# Patient Record
Sex: Female | Born: 1965 | Race: White | Hispanic: No | Marital: Single | Smoking: Current every day smoker
Health system: Southern US, Community
[De-identification: ages and names within clinical notes are randomized; demographics above are authoritative.]

## PROBLEM LIST (undated history)

## (undated) DIAGNOSIS — S0990XA Unspecified injury of head, initial encounter: Secondary | ICD-10-CM

## (undated) DIAGNOSIS — N390 Urinary tract infection, site not specified: Secondary | ICD-10-CM

## (undated) DIAGNOSIS — F419 Anxiety disorder, unspecified: Secondary | ICD-10-CM

## (undated) DIAGNOSIS — G43909 Migraine, unspecified, not intractable, without status migrainosus: Secondary | ICD-10-CM

## (undated) DIAGNOSIS — J449 Chronic obstructive pulmonary disease, unspecified: Secondary | ICD-10-CM

## (undated) DIAGNOSIS — K219 Gastro-esophageal reflux disease without esophagitis: Secondary | ICD-10-CM

## (undated) DIAGNOSIS — Z720 Tobacco use: Secondary | ICD-10-CM

## (undated) DIAGNOSIS — G8929 Other chronic pain: Secondary | ICD-10-CM

## (undated) DIAGNOSIS — R519 Headache, unspecified: Secondary | ICD-10-CM

## (undated) HISTORY — DX: Urinary tract infection, site not specified: N39.0

## (undated) HISTORY — DX: Other chronic pain: G89.29

## (undated) HISTORY — DX: Tobacco use: Z72.0

## (undated) HISTORY — PX: FRACTURE SURGERY: SHX138

## (undated) HISTORY — DX: Anxiety disorder, unspecified: F41.9

## (undated) HISTORY — DX: Chronic obstructive pulmonary disease, unspecified: J44.9

## (undated) HISTORY — DX: Headache, unspecified: R51.9

## (undated) HISTORY — DX: Migraine, unspecified, not intractable, without status migrainosus: G43.909

## (undated) HISTORY — DX: Unspecified injury of head, initial encounter: S09.90XA

## (undated) HISTORY — DX: Gastro-esophageal reflux disease without esophagitis: K21.9

## (undated) HISTORY — PX: NO PAST SURGERIES: SHX2092

---
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
## 2009-06-07 ENCOUNTER — Encounter: Admission: RE | Admit: 2009-06-07 | Discharge: 2009-06-07 | Payer: Self-pay | Admitting: Nurse Practitioner

## 2012-12-21 ENCOUNTER — Other Ambulatory Visit: Payer: Self-pay | Admitting: Podiatry

## 2012-12-21 DIAGNOSIS — M7750 Other enthesopathy of unspecified foot: Secondary | ICD-10-CM

## 2012-12-21 DIAGNOSIS — M7660 Achilles tendinitis, unspecified leg: Secondary | ICD-10-CM

## 2012-12-27 ENCOUNTER — Other Ambulatory Visit: Payer: Self-pay

## 2019-06-07 ENCOUNTER — Other Ambulatory Visit: Payer: Self-pay | Admitting: Physician Assistant

## 2019-06-07 DIAGNOSIS — Z1231 Encounter for screening mammogram for malignant neoplasm of breast: Secondary | ICD-10-CM

## 2019-06-07 DIAGNOSIS — Z803 Family history of malignant neoplasm of breast: Secondary | ICD-10-CM

## 2019-06-09 ENCOUNTER — Other Ambulatory Visit: Payer: Self-pay

## 2019-06-09 ENCOUNTER — Ambulatory Visit
Admission: RE | Admit: 2019-06-09 | Discharge: 2019-06-09 | Disposition: A | Discharge status: Home / Self Care | Payer: Self-pay | Source: Ambulatory Visit | Attending: Physician Assistant | Admitting: Physician Assistant

## 2019-06-09 DIAGNOSIS — Z803 Family history of malignant neoplasm of breast: Secondary | ICD-10-CM

## 2019-06-09 DIAGNOSIS — Z1231 Encounter for screening mammogram for malignant neoplasm of breast: Secondary | ICD-10-CM

## 2019-09-05 NOTE — Progress Notes (Addendum)
[id number]    Provider:  Dr Lucia Gaskins Requesting Provider: Mitzi Hansen, NP Primary Care Provider:  Roseanna Rainbow, PA-C (Inactive)  CC:  migraines  HPI:  Emily Holmes is a 54 y.o. female here as requested by Mitzi Hansen, NP for migraines.  I reviewed [facility] notes: Past medical history migraines, anxiety, stress incontinence, mixed type COPD with acute bronchitis, adnexal fullness and tenderness.  Medications that she has tried that could be used in migraine management include Wellbutrin and topiramate, steroids, sumatriptan, propranolol.  She has apparently been to a headache clinic before but told Mitzi Hansen that she was not impressed with them.  At last appointment she reported increased frequency, lately getting headaches about once a week, episode lasting 1 to 3 days, headaches causing vision changes, vertigo, nausea, pressure behind the eyes, body temp changes, associated light and sound sensitivity, head feels like someone is driving red hot ice packs, barb wire wrapped around her head, she has also tried Fioricet, B complex, feverfew, riboflavin and magnesium, propranolol.  Recent labs include CMP collected June 01, 2019, essentially normal, BUN 7, creatinine 0.48.  Imitrex makes her sleepy, in the past they had discussed Elavil and Aimovig or other injectables, ibuprofen when she develops migraines, significant weather seems to bring on migraines as well, she has associated visual disturbance, nausea, tinnitus and dizziness, she apparently has FMLA where she is limited to 10 hours a day due to her migraines, she can only work 6 days in a row, she definitely noted more headaches with working many days in a row such as 21 which she reports her work has asked of her.  I reviewed records all the way through 2017, she is also stated headaches are severe 7 out of 10.  meds tried: Wellbutrin and topiramate, steroids, sumatriptan, propranolol ,topiramate,  fioricet, b complex, feverfew, riboflavin, magnesium,   Patient states migraines since childhood, mother/nephew/sister/grandmother; extensive family history f migraines. She has had 5 head injuries in childhood unsure if related. Migraines started worsening since early menopause in 3s and slowly progressively worsening, over the last year or longer she has daily headaches, she wakes up with daily headaches and we discussed a sleep study and she declines. She does not sleep well. Migraines cause disequilibrium, vertigo, vision changes can't see the words on the past, can be unilateral or start both sides, from the back, pulsating/pounding/thrbbing.ice picks, nausea, light/sound sensitivity, movement make it worse, laying down in a black out room helps, weather barometric pressure makes it worse. She has vertigo, half her body gets cold even without the headache and happens with the headache pain. She has ringing in her ears not pulsatile, no aura, no medication overuse,also feels aband around the head and severe pressure,  ibuprofen doesn't really help. No other focal neurologic deficits, associated symptoms, inciting events or modifiable factors.  Reviewed notes, labs and imaging from outside physicians, which showed: see above  She had labs this past march, will request results do not have them  Reviewed MRI of th ebrain report:  IMPRESSION:  1. No acute intracranial abnormality.  2. At least a single small discrete white matter lesion in both external  capsules. These lesions are abnormal but nonspecific, usually resulting  from benign/remote/incidental causes (e.g. Prior  trauma/inflammation/demyelinization, or chronic ischemia associated with  migraine/atherosclerosis/other vasculopathies processes). Favor mild  chronic ischemic/age-relatedchanges   Review of Systems: Patient complains of symptoms per HPI as well as the following symptoms: anxiety, smoking, headache. Pertinent negatives  and  positives per HPI. All others negative.   Social History   Socioeconomic History   Marital status: Single    Spouse name: Not on file   Number of children: 1   Years of education: Not on file   Highest education level: Not on file  Occupational History   Not on file  Tobacco Use   Smoking status: Current Every Day Smoker    Packs/day: 1.00    Types: Cigarettes   Smokeless tobacco: Never Used  Substance and Sexual Activity   Alcohol use: Yes    Comment: occasional glass of wine   Drug use: Not Currently    Comment: used weed in her 83s    Sexual activity: Not on file  Other Topics Concern   Not on file  Social History Narrative   Lives alone   Caffeine: 2 large cups per day    Social Determinants of Health   Financial Resource Strain:    Difficulty of Paying Living Expenses:   Food Insecurity:    Worried About Programme researcher, broadcasting/film/video in the Last Year:    Barista in the Last Year:   Transportation Needs:    Freight forwarder (Medical):    Lack of Transportation (Non-Medical):   Physical Activity:    Days of Exercise per Week:    Minutes of Exercise per Session:   Stress:    Feeling of Stress :   Social Connections:    Frequency of Communication with Friends and Family:    Frequency of Social Gatherings with Friends and Family:    Attends Religious Services:    Active Member of Clubs or Organizations:    Attends Engineer, structural:    Marital Status:   Intimate Partner Violence:    Fear of Current or Ex-Partner:    Emotionally Abused:    Physically Abused:    Sexually Abused:     Family History  Problem Relation Age of Onset   Migraines Mother    Colon cancer Mother    Kidney cancer Mother        started here, small cell, metastastic, less than 6 months    Diabetes Mother    Heart disease Brother    Cancer Brother    Migraines Maternal Grandmother    Stroke Maternal Grandmother    Heart  attack Maternal Grandmother    Cancer Maternal Grandmother    Emphysema Maternal Grandmother    COPD Maternal Grandmother    Migraines Other        on maternal side    Migraines Nephew     Past Medical History:  Diagnosis Date   Acid reflux    Anxiety    claustrophobia   Chronic headaches    COPD (chronic obstructive pulmonary disease) (HCC)    Fracture    R heel hairline around 2007   Fracture 1974   left arm s/p internal fixation   Frequent urinary tract infections    Head injury    as a child x5    Migraines    Tobacco use     Patient Active Problem List   Diagnosis Date Noted   Chronic migraine without aura without status migrainosus, not intractable 09/06/2019    Past Surgical History:  Procedure Laterality Date   NO PAST SURGERIES      Current Outpatient Medications  Medication Sig Dispense Refill   albuterol (PROAIR HFA) 108 (90 Base) MCG/ACT inhaler Inhale 2 puffs into the lungs 3 (  three) times daily as needed for wheezing or shortness of breath.     busPIRone (BUSPAR) 10 MG tablet Take 10 mg by mouth 2 (two) times daily.      cetirizine (ZYRTEC) 10 MG tablet Take 10 mg by mouth daily.     clobetasol cream (TEMOVATE) 0.05 % Apply topically 2 (two) times daily.     Cranberry-Vitamin C-Vitamin E 140-100-3 MG-MG-UNIT CAPS Take by mouth daily.      omeprazole (PRILOSEC) 20 MG capsule Take 20 mg by mouth daily.     propranolol ER (INDERAL LA) 160 MG SR capsule Take 160 mg by mouth daily.     SUMAtriptan (IMITREX) 50 MG tablet Take 50 mg by mouth 2 (two) times daily as needed.     Fremanezumab-vfrm (AJOVY) 225 MG/1.5ML SOAJ Inject 225 mg into the skin every 30 (thirty) days. 3 pen 4   Ubrogepant (UBRELVY) 100 MG TABS Take 100 mg by mouth every 2 (two) hours as needed. Maximum 200mg  a day. 9 tablet 11   No current facility-administered medications for this visit.    Allergies as of 09/06/2019 - Review Complete 09/06/2019  Allergen  Reaction Noted   Codeine Hives and Itching 09/06/2019   Other  09/06/2019   Shellfish-derived products  09/06/2019   Topiramate  09/06/2019   Wellbutrin [bupropion]  09/06/2019    Vitals: BP 122/82 (BP Location: Right Arm, Patient Position: Sitting)    Pulse 63    Ht 5' 2.75" (1.594 m)    Wt 195 lb (88.5 kg)    SpO2 94%    BMI 34.82 kg/m  Last Weight:  Wt Readings from Last 1 Encounters:  09/06/19 195 lb (88.5 kg)   Last Height:   Ht Readings from Last 1 Encounters:  09/06/19 5' 2.75" (1.594 m)     Physical exam: Exam: Gen: NAD, conversant, well nourised, obese, well groomed                     CV: RRR, no MRG. No Carotid Bruits. No peripheral edema, warm, nontender Eyes: Conjunctivae clear without exudates or hemorrhage  Neuro: Detailed Neurologic Exam  Speech:    Speech is normal; fluent and spontaneous with normal comprehension.  Cognition:    The patient is oriented to person, place, and time;     recent and remote memory intact;     language fluent;     normal attention, concentration,     fund of knowledge Cranial Nerves:    The pupils are equal, round, and reactive to light. Attempted could not visualize fundiVisual fields are full to finger confrontation. Extraocular movements are intact. Trigeminal sensation is intact and the muscles of mastication are normal. The face is symmetric. The palate elevates in the midline. Hearing intact. Voice is normal. Shoulder shrug is normal. The tongue has normal motion without fasciculations.   Coordination:    No dysmetria or ataxia  Gait:    Normal native gait  Motor Observation:    No asymmetry, no atrophy, and no involuntary movements noted. Tone:    Normal muscle tone.    Posture:    Posture is normal. normal erect    Strength:    Strength is V/V in the upper and lower limbs.      Sensation: intact to LT     Reflex Exam:  DTR's:    Deep tendon reflexes in the upper and lower extremities are  symmetrical bilaterally.   Toes:    The toes are  equiv bilaterally.   Clonus:    Clonus is absent.    Assessment/Plan:  37 year ol with chronic migraines, failed multiple medications, sill start Ajovy preventative and   over the last year or longer she has daily headaches, she wakes up with daily headaches and we discussed a sleep study and she declines.  Orders Placed This Encounter  Procedures   CBC   Comprehensive metabolic panel   TSH   Meds ordered this encounter  Medications   Fremanezumab-vfrm (AJOVY) 225 MG/1.5ML SOAJ    Sig: Inject 225 mg into the skin every 30 (thirty) days.    Dispense:  3 pen    Refill:  4    Patient has copay card; she can have medication for $5 regardless of insurance approval or copay amount.   Ubrogepant (UBRELVY) 100 MG TABS    Sig: Take 100 mg by mouth every 2 (two) hours as needed. Maximum 200mg  a day.    Dispense:  9 tablet    Refill:  11    Cc: , NP,  Mitzi Hansen, PA-C (Inactive)  Roseanna Rainbow, MD  [facility] [address] [place], [place]  Phone [phone] Fax [phone]

## 2019-09-06 ENCOUNTER — Other Ambulatory Visit: Payer: Self-pay

## 2019-09-06 ENCOUNTER — Encounter: Payer: Self-pay | Admitting: Neurology

## 2019-09-06 ENCOUNTER — Ambulatory Visit: Payer: BC Managed Care – PPO | Admitting: Neurology

## 2019-09-06 VITALS — BP 122/82 | HR 63 | Ht 62.75 in | Wt 195.0 lb

## 2019-09-06 DIAGNOSIS — G43709 Chronic migraine without aura, not intractable, without status migrainosus: Secondary | ICD-10-CM | POA: Diagnosis not present

## 2019-09-06 MED ORDER — UBRELVY 100 MG PO TABS: 100.0000 mg | ORAL_TABLET | ORAL | 11 refills | Status: DC | PRN

## 2019-09-06 MED ORDER — AJOVY 225 MG/1.5ML ~~LOC~~ SOAJ: 225.0000 mg | SUBCUTANEOUS | 4 refills | Status: DC

## 2019-09-06 NOTE — Patient Instructions (Signed)
Start Ajovy for prevention Start Ubrelvy for acute management  Ubrogepant tablets What is this medicine? UBROGEPANT (ue BROE je pant) is used to treat migraine headaches with or without aura. An aura is a strange feeling or visual disturbance that warns you of an attack. It is not used to prevent migraines. This medicine may be used for other purposes; ask your health care provider or pharmacist if you have questions. COMMON BRAND NAME(S): Bernita Raisin What should I tell my health care provider before I take this medicine? They need to know if you have any of these conditions:  kidney disease  liver disease  an unusual or allergic reaction to ubrogepant, other medicines, foods, dyes, or preservatives  pregnant or trying to get pregnant  breast-feeding How should I use this medicine? Take this medicine by mouth with a glass of water. Follow the directions on the prescription label. You can take it with or without food. If it upsets your stomach, take it with food. Take your medicine at regular intervals. Do not take it more often than directed. Do not stop taking except on your doctor's advice. Talk to your pediatrician about the use of this medicine in children. Special care may be needed. Overdosage: If you think you have taken too much of this medicine contact a poison control center or emergency room at once. NOTE: This medicine is only for you. Do not share this medicine with others. What if I miss a dose? This does not apply. This medicine is not for regular use. What may interact with this medicine? Do not take this medicine with any of the following medicines:  ceritinib  certain antibiotics like chloramphenicol, clarithromycin, telithromycin  certain antivirals for HIV like atazanavir, cobicistat, darunavir, delavirdine, fosamprenavir, indinavir, ritonavir  certain medicines for fungal infections like itraconazole, ketoconazole, posaconazole,  voriconazole  conivaptan  grapefruit  idelalisib  mifepristone  nefazodone  ribociclib This medicine may also interact with the following medications:  carvedilol  certain medicines for seizures like phenobarbital, phenytoin  ciprofloxacin  cyclosporine  eltrombopag  fluconazole  fluvoxamine  quinidine  rifampin  St. John's wort  verapamil This list may not describe all possible interactions. Give your health care provider a list of all the medicines, herbs, non-prescription drugs, or dietary supplements you use. Also tell them if you smoke, drink alcohol, or use illegal drugs. Some items may interact with your medicine. What should I watch for while using this medicine? Visit your health care professional for regular checks on your progress. Tell your health care professional if your symptoms do not start to get better or if they get worse. Your mouth may get dry. Chewing sugarless gum or sucking hard candy and drinking plenty of water may help. Contact your health care professional if the problem does not go away or is severe. What side effects may I notice from receiving this medicine? Side effects that you should report to your doctor or health care professional as soon as possible:  allergic reactions like skin rash, itching or hives; swelling of the face, lips, or tongue Side effects that usually do not require medical attention (report these to your doctor or health care professional if they continue or are bothersome):  drowsiness  dry mouth  nausea  tiredness This list may not describe all possible side effects. Call your doctor for medical advice about side effects. You may report side effects to FDA at 1-800-FDA-1088. Where should I keep my medicine? Keep out of the reach of children. Store  at room temperature between 15 and 30 degrees C (59 and 86 degrees F). Throw away any unused medicine after the expiration date. NOTE: This sheet is a summary. It  may not cover all possible information. If you have questions about this medicine, talk to your doctor, pharmacist, or health care provider.  2020 Elsevier/Gold Standard (2018-05-12 08:50:55) Vernell Barrier injection What is this medicine? FREMANEZUMAB (fre ma NEZ ue mab) is used to prevent migraine headaches. This medicine may be used for other purposes; ask your health care provider or pharmacist if you have questions. COMMON BRAND NAME(S): AJOVY What should I tell my health care provider before I take this medicine? They need to know if you have any of these conditions:  an unusual or allergic reaction to fremanezumab, other medicines, foods, dyes, or preservatives  pregnant or trying to get pregnant  breast-feeding How should I use this medicine? This medicine is for injection under the skin. You will be taught how to prepare and give this medicine. Use exactly as directed. Take your medicine at regular intervals. Do not take your medicine more often than directed. It is important that you put your used needles and syringes in a special sharps container. Do not put them in a trash can. If you do not have a sharps container, call your pharmacist or healthcare provider to get one. Talk to your pediatrician regarding the use of this medicine in children. Special care may be needed. Overdosage: If you think you have taken too much of this medicine contact a poison control center or emergency room at once. NOTE: This medicine is only for you. Do not share this medicine with others. What if I miss a dose? If you miss a dose, take it as soon as you can. If it is almost time for your next dose, take only that dose. Do not take double or extra doses. What may interact with this medicine? Interactions are not expected. This list may not describe all possible interactions. Give your health care provider a list of all the medicines, herbs, non-prescription drugs, or dietary supplements you use. Also  tell them if you smoke, drink alcohol, or use illegal drugs. Some items may interact with your medicine. What should I watch for while using this medicine? Tell your doctor or healthcare professional if your symptoms do not start to get better or if they get worse. What side effects may I notice from receiving this medicine? Side effects that you should report to your doctor or health care professional as soon as possible:  allergic reactions like skin rash, itching or hives, swelling of the face, lips, or tongue Side effects that usually do not require medical attention (report these to your doctor or health care professional if they continue or are bothersome):  pain, redness, or irritation at site where injected This list may not describe all possible side effects. Call your doctor for medical advice about side effects. You may report side effects to FDA at 1-800-FDA-1088. Where should I keep my medicine? Keep out of the reach of children. You will be instructed on how to store this medicine. Throw away any unused medicine after the expiration date on the label. NOTE: This sheet is a summary. It may not cover all possible information. If you have questions about this medicine, talk to your doctor, pharmacist, or health care provider.  2020 Elsevier/Gold Standard (2016-11-23 17:22:56)

## 2019-09-06 NOTE — Addendum Note (Signed)
Addended by: Naomie Dean B on: 09/06/2019 10:17 AM   Modules accepted: Orders, Level of Service

## 2019-09-07 ENCOUNTER — Telehealth: Payer: Self-pay | Admitting: Neurology

## 2019-09-07 LAB — COMPREHENSIVE METABOLIC PANEL
ALT: 17 IU/L (ref 0–32)
AST: 20 IU/L (ref 0–40)
Albumin/Globulin Ratio: 1.7 (ref 1.2–2.2)
Albumin: 4.3 g/dL (ref 3.8–4.9)
Alkaline Phosphatase: 90 IU/L (ref 48–121)
BUN/Creatinine Ratio: 13 (ref 9–23)
BUN: 7 mg/dL (ref 6–24)
Bilirubin Total: 0.5 mg/dL (ref 0.0–1.2)
CO2: 26 mmol/L (ref 20–29)
Calcium: 9.4 mg/dL (ref 8.7–10.2)
Chloride: 101 mmol/L (ref 96–106)
Creatinine, Ser: 0.52 mg/dL — ABNORMAL LOW (ref 0.57–1.00)
GFR calc Af Amer: 128 mL/min/{1.73_m2} (ref >59)
GFR calc non Af Amer: 111 mL/min/{1.73_m2} (ref >59)
Globulin, Total: 2.6 g/dL (ref 1.5–4.5)
Glucose: 87 mg/dL (ref 65–99)
Potassium: 5 mmol/L (ref 3.5–5.2)
Sodium: 139 mmol/L (ref 134–144)
Total Protein: 6.9 g/dL (ref 6.0–8.5)

## 2019-09-07 LAB — CBC
Hematocrit: 43 % (ref 34.0–46.6)
Hemoglobin: 14.4 g/dL (ref 11.1–15.9)
MCH: 29.6 pg (ref 26.6–33.0)
MCHC: 33.5 g/dL (ref 31.5–35.7)
MCV: 88 fL (ref 79–97)
Platelets: 286 10*3/uL (ref 150–450)
RBC: 4.87 x10E6/uL (ref 3.77–5.28)
RDW: 11.9 % (ref 11.7–15.4)
WBC: 7.5 10*3/uL (ref 3.4–10.8)

## 2019-09-07 LAB — TSH: TSH: 1.94 u[IU]/mL (ref 0.450–4.500)

## 2019-09-07 MED ORDER — UBRELVY 100 MG PO TABS: 100.0000 mg | ORAL_TABLET | ORAL | 11 refills | Status: DC | PRN

## 2019-09-07 NOTE — Telephone Encounter (Signed)
Looks like it processed as a sample. I changed it to normal process and it was sent to pharmacy successfully.

## 2019-09-07 NOTE — Addendum Note (Signed)
Addended by: Bertram Savin on: 09/07/2019 02:50 PM   Modules accepted: Orders

## 2019-09-07 NOTE — Telephone Encounter (Signed)
Pt has called to report that the pharmacy confirmed her Ubrogepant (UBRELVY) 100 MG TABS  Was never called into [facility] [phone]

## 2019-09-13 ENCOUNTER — Telehealth: Payer: Self-pay | Admitting: *Deleted

## 2019-09-13 NOTE — Telephone Encounter (Signed)
Spoke with pt and advised her labs looks fine, unremarkable per Dr. Lucia Gaskins. Pt verbalized appreciation and understanding.

## 2019-09-13 NOTE — Telephone Encounter (Signed)
-----   Message from Anson Fret, MD sent at 09/07/2019  9:19 AM EDT ----- Labs look fine, unremarkable thanks

## 2020-01-01 ENCOUNTER — Encounter: Payer: Self-pay | Admitting: Family Medicine

## 2020-01-01 ENCOUNTER — Ambulatory Visit: Payer: BC Managed Care – PPO | Admitting: Family Medicine

## 2020-01-01 VITALS — BP 161/87 | HR 73 | Ht 62.0 in | Wt 199.0 lb

## 2020-01-01 DIAGNOSIS — G43709 Chronic migraine without aura, not intractable, without status migrainosus: Secondary | ICD-10-CM

## 2020-01-01 NOTE — Patient Instructions (Addendum)
We will continue Ajovy every month. Use Ubrelvy for abortive therapy. You can repeat tablet in 2 hours if needed. Avoid taking more than 2 in 24 hours or 10 per month. Consider Botox therapy if insurance will cover.   Stay well hydrated. Try incorporating regular water with your Bai water. Eat regular meals and try to exercise regularly.   Follow up closely with PCP for monitoring of BP. Consider sleep study.   Follow up with [place] in 1 year, sooner if needed.     Sleep Apnea Sleep apnea affects breathing during sleep. It causes breathing to stop for a short time or to become shallow. It can also increase the risk of:  Heart attack.  Stroke.  Being very overweight (obese).  Diabetes.  Heart failure.  Irregular heartbeat. The goal of treatment is to help you breathe normally again. What are the causes? There are three kinds of sleep apnea:  Obstructive sleep apnea. This is caused by a blocked or collapsed airway.  Central sleep apnea. This happens when the brain does not send the right signals to the muscles that control breathing.  Mixed sleep apnea. This is a combination of obstructive and central sleep apnea. The most common cause of this condition is a collapsed or blocked airway. This can happen if:  Your throat muscles are too relaxed.  Your tongue and tonsils are too large.  You are overweight.  Your airway is too small. What increases the risk?  Being overweight.  Smoking.  Having a small airway.  Being older.  Being female.  Drinking alcohol.  Taking medicines to calm yourself (sedatives or tranquilizers).  Having family members with the condition. What are the signs or symptoms?  Trouble staying asleep.  Being sleepy or tired during the day.  Getting angry a lot.  Loud snoring.  Headaches in the morning.  Not being able to focus your mind (concentrate).  Forgetting things.  Less interest in sex.  Mood swings.  Personality  changes.  Feelings of sadness (depression).  Waking up a lot during the night to pee (urinate).  Dry mouth.  Sore throat. How is this diagnosed?  Your medical history.  A physical exam.  A test that is done when you are sleeping (sleep study). The test is most often done in a sleep lab but may also be done at home. How is this treated?   Sleeping on your side.  Using a medicine to get rid of mucus in your nose (decongestant).  Avoiding the use of alcohol, medicines to help you relax, or certain pain medicines (narcotics).  Losing weight, if needed.  Changing your diet.  Not smoking.  Using a machine to open your airway while you sleep, such as: ? An oral appliance. This is a mouthpiece that shifts your lower jaw forward. ? A CPAP device. This device blows air through a mask when you breathe out (exhale). ? An EPAP device. This has valves that you put in each nostril. ? A BPAP device. This device blows air through a mask when you breathe in (inhale) and breathe out.  Having surgery if other treatments do not work. It is important to get treatment for sleep apnea. Without treatment, it can lead to:  High blood pressure.  Coronary artery disease.  In men, not being able to have an erection (impotence).  Reduced thinking ability. Follow these instructions at home: Lifestyle  Make changes that your doctor recommends.  Eat a healthy diet.  Lose weight if  needed.  Avoid alcohol, medicines to help you relax, and some pain medicines.  Do not use any products that contain nicotine or tobacco, such as cigarettes, e-cigarettes, and chewing tobacco. If you need help quitting, ask your doctor. General instructions  Take over-the-counter and prescription medicines only as told by your doctor.  If you were given a machine to use while you sleep, use it only as told by your doctor.  If you are having surgery, make sure to tell your doctor you have sleep apnea. You may  need to bring your device with you.  Keep all follow-up visits as told by your doctor. This is important. Contact a doctor if:  The machine that you were given to use during sleep bothers you or does not seem to be working.  You do not get better.  You get worse. Get help right away if:  Your chest hurts.  You have trouble breathing in enough air.  You have an uncomfortable feeling in your back, arms, or stomach.  You have trouble talking.  One side of your body feels weak.  A part of your face is hanging down. These symptoms may be an emergency. Do not wait to see if the symptoms will go away. Get medical help right away. Call your local emergency services (911 in the [place] Do not drive yourself to the hospital. Summary  This condition affects breathing during sleep.  The most common cause is a collapsed or blocked airway.  The goal of treatment is to help you breathe normally while you sleep. This information is not intended to replace advice given to you by your health care provider. Make sure you discuss any questions you have with your health care provider. Document Revised: 12/10/2017 Document Reviewed: 10/19/2017 Elsevier Patient Education  2020 [employer]   Migraine Headache A migraine headache is a very strong throbbing pain on one side or both sides of your head. This type of headache can also cause other symptoms. It can last from 4 hours to 3 days. Talk with your doctor about what things may bring on (trigger) this condition. What are the causes? The exact cause of this condition is not known. This condition may be triggered or caused by:  Drinking alcohol.  Smoking.  Taking medicines, such as: ? Medicine used to treat chest pain (nitroglycerin). ? Birth control pills. ? Estrogen. ? Some blood pressure medicines.  Eating or drinking certain products.  Doing physical activity. Other things that may trigger a migraine headache include:  Having a  menstrual period.  Pregnancy.  Hunger.  Stress.  Not getting enough sleep or getting too much sleep.  Weather changes.  Tiredness (fatigue). What increases the risk?  Being 3625-52 years old.  Being female.  Having a family history of migraine headaches.  Being Caucasian.  Having depression or anxiety.  Being very overweight. What are the signs or symptoms?  A throbbing pain. This pain may: ? Happen in any area of the head, such as on one side or both sides. ? Make it hard to do daily activities. ? Get worse with physical activity. ? Get worse around bright lights or loud noises.  Other symptoms may include: ? Feeling sick to your stomach (nauseous). ? Vomiting. ? Dizziness. ? Being sensitive to bright lights, loud noises, or smells.  Before you get a migraine headache, you may get warning signs (an aura). An aura may include: ? Seeing flashing lights or having blind spots. ? Seeing bright spots,  halos, or zigzag lines. ? Having tunnel vision or blurred vision. ? Having numbness or a tingling feeling. ? Having trouble talking. ? Having weak muscles.  Some people have symptoms after a migraine headache (postdromal phase), such as: ? Tiredness. ? Trouble thinking (concentrating). How is this treated?  Taking medicines that: ? Relieve pain. ? Relieve the feeling of being sick to your stomach. ? Prevent migraine headaches.  Treatment may also include: ? Having acupuncture. ? Avoiding foods that bring on migraine headaches. ? Learning ways to control your body functions (biofeedback). ? Therapy to help you know and deal with negative thoughts (cognitive behavioral therapy). Follow these instructions at home: Medicines  Take over-the-counter and prescription medicines only as told by your doctor.  Ask your doctor if the medicine prescribed to you: ? Requires you to avoid driving or using heavy machinery. ? Can cause trouble pooping (constipation). You may  need to take these steps to prevent or treat trouble pooping:  Drink enough fluid to keep your pee (urine) pale yellow.  Take over-the-counter or prescription medicines.  Eat foods that are high in fiber. These include beans, whole grains, and fresh fruits and vegetables.  Limit foods that are high in fat and sugar. These include fried or sweet foods. Lifestyle  Do not drink alcohol.  Do not use any products that contain nicotine or tobacco, such as cigarettes, e-cigarettes, and chewing tobacco. If you need help quitting, ask your doctor.  Get at least 8 hours of sleep every night.  Limit and deal with stress. General instructions      Keep a journal to find out what may bring on your migraine headaches. For example, write down: ? What you eat and drink. ? How much sleep you get. ? Any change in what you eat or drink. ? Any change in your medicines.  If you have a migraine headache: ? Avoid things that make your symptoms worse, such as bright lights. ? It may help to lie down in a dark, quiet room. ? Do not drive or use heavy machinery. ? Ask your doctor what activities are safe for you.  Keep all follow-up visits as told by your doctor. This is important. Contact a doctor if:  You get a migraine headache that is different or worse than others you have had.  You have more than 15 headache days in one month. Get help right away if:  Your migraine headache gets very bad.  Your migraine headache lasts longer than 72 hours.  You have a fever.  You have a stiff neck.  You have trouble seeing.  Your muscles feel weak or like you cannot control them.  You start to lose your balance a lot.  You start to have trouble walking.  You pass out (faint).  You have a seizure. Summary  A migraine headache is a very strong throbbing pain on one side or both sides of your head. These headaches can also cause other symptoms.  This condition may be treated with medicines  and changes to your lifestyle.  Keep a journal to find out what may bring on your migraine headaches.  Contact a doctor if you get a migraine headache that is different or worse than others you have had.  Contact your doctor if you have more than 15 headache days in a month. This information is not intended to replace advice given to you by your health care provider. Make sure you discuss any questions you have with your  health care provider. Document Revised: 06/17/2018 Document Reviewed: 04/07/2018 Elsevier Patient Education  2020 [employer].

## 2020-01-01 NOTE — Progress Notes (Addendum)
Chief Complaint  Patient presents with  . Follow-up    rm 2  . Migraine    pt said the meds helped for the first month     HISTORY OF PRESENT ILLNESS: Today 01/01/20  Emily Holmes is a 54 y.o. female here today for follow up for migraines. She was started on [place] in 08/2019. She also continues propranolol LA 160mg  daily. She feels that injections have helped with daily tension headaches. She continues to have near daily tension style headaches that are not as severe. has helped reduce severity of migraines but does not abort. She reports 2-3 migraines per month. She gets a migraine with every rain storm. She has never repeated Ubrelvy dose. She seems to have morning headaches often but states they can be worse in the evenings. BP 161/87 today. Last office reading 122/82. Blood pressure is usually "good" and it is only elevated when she has a migraine. She is unable to tell me a specific readings. She has a significant family history of HTN. She is followed by PCP every 6 months. Her mom had OSA. She does not wish to pursue sleep study. She knows that she will not use CPAP. She is not interested in considering other treatments for OSA. She drinks 2-3 Bai drinks daily. She does not drink regular water. She was previously advised to consider Botox but she states insurance will not cover it.    HISTORY (copied from Dr Bernita Raisin note on 09/06/2019)  HPI:  Emily Holmes is a 54 y.o. female here as requested by 46, NP for migraines.  I reviewed Emily Holmes notes: Past medical history migraines, anxiety, stress incontinence, mixed type COPD with acute bronchitis, adnexal fullness and tenderness.  Medications that she has tried that could be used in migraine management include Wellbutrin and topiramate, steroids, sumatriptan, propranolol.  She has apparently been to a headache clinic before but told [facility] that she was not impressed with them.  At last  appointment she reported increased frequency, lately getting headaches about once a week, episode lasting 1 to 3 days, headaches causing vision changes, vertigo, nausea, pressure behind the eyes, body temp changes, associated light and sound sensitivity, head feels like someone is driving red hot ice packs, barb wire wrapped around her head, she has also tried Fioricet, B complex, feverfew, riboflavin and magnesium, propranolol.  Recent labs include CMP collected June 01, 2019, essentially normal, BUN 7, creatinine 0.48.  Imitrex makes her sleepy, in the past they had discussed Elavil and Aimovig or other injectables, ibuprofen when she develops migraines, significant weather seems to bring on migraines as well, she has associated visual disturbance, nausea, tinnitus and dizziness, she apparently has FMLA where she is limited to 10 hours a day due to her migraines, she can only work 6 days in a row, she definitely noted more headaches with working many days in a row such as 21 which she reports her work has asked of her.  I reviewed records all the way through 2017, she is also stated headaches are severe 7 out of 10.  meds tried: Wellbutrin and topiramate, steroids, sumatriptan, propranolol ,topiramate, fioricet, b complex, feverfew, riboflavin, magnesium,   Patient states migraines since childhood, mother/nephew/sister/grandmother; extensive family history f migraines. She has had 5 head injuries in childhood unsure if related. Migraines started worsening since early menopause in 70s and slowly progressively worsening, over the last year or longer she has daily headaches, she wakes up with  daily headaches and we discussed a sleep study and she declines. She does not sleep well. Migraines cause disequilibrium, vertigo, vision changes can't see the words on the past, can be unilateral or start both sides, from the back, pulsating/pounding/thrbbing.ice picks, nausea, light/sound sensitivity, movement make it  worse, laying down in a black out room helps, weather barometric pressure makes it worse. She has vertigo, half her body gets cold even without the headache and happens with the headache pain. She has ringing in her ears not pulsatile, no aura, no medication overuse,also feels aband around the head and severe pressure,  ibuprofen doesn't really help. No other focal neurologic deficits, associated symptoms, inciting events or modifiable factors.  Reviewed notes, labs and imaging from outside physicians, which showed: see above  She had labs this past march, will request results do not have them  Reviewed MRI of th ebrain report:  IMPRESSION:  1. No acute intracranial abnormality.  2. At least a single small discrete white matter lesion in both external  capsules. These lesions are abnormal but nonspecific, usually resulting  from benign/remote/incidental causes (e.g. Prior  trauma/inflammation/demyelinization, or chronic ischemia associated with  migraine/atherosclerosis/other vasculopathies processes). Favor mild  chronic ischemic/age-relatedchanges     REVIEW OF SYSTEMS: Out of a complete 14 system review of symptoms, the patient complains only of the following symptoms, daily headaches, sinus congestion, and all other reviewed systems are negative.   ALLERGIES: Allergies  Allergen Reactions  . Codeine Hives and Itching    Inside and out   . Other     GLUCOSAMINE (has shellfish)- stomach upset STEROIDS- severe body aches and pains and it hurt to breathe WOOL- hives, itching   . Shellfish-Derived Products     Stomach upset  . Topiramate     Insomnia, loss of appetite  . Wellbutrin [Bupropion]     Worsening migraines     HOME MEDICATIONS: Outpatient Medications Prior to Visit  Medication Sig Dispense Refill  . albuterol (PROAIR HFA) 108 (90 Base) MCG/ACT inhaler Inhale 2 puffs into the lungs 3 (three) times daily as needed for wheezing or shortness of breath.    .  busPIRone (BUSPAR) 10 MG tablet Take 10 mg by mouth 2 (two) times daily.     . cetirizine (ZYRTEC) 10 MG tablet Take 10 mg by mouth daily.    . clobetasol cream (TEMOVATE) 0.05 % Apply topically 2 (two) times daily.    . Cranberry-Vitamin C-Vitamin E 140-100-3 MG-MG-UNIT CAPS Take by mouth daily.     . Fremanezumab-vfrm (AJOVY) 225 MG/1.5ML SOAJ Inject 225 mg into the skin every 30 (thirty) days. 3 pen 4  . omeprazole (PRILOSEC) 20 MG capsule Take 20 mg by mouth daily.    . propranolol ER (INDERAL LA) 160 MG SR capsule Take 160 mg by mouth daily.    . SUMAtriptan (IMITREX) 50 MG tablet Take 50 mg by mouth 2 (two) times daily as needed.    Marland Kitchen Ubrogepant (UBRELVY) 100 MG TABS Take 100 mg by mouth every 2 (two) hours as needed. Maximum 200mg  a day. 9 tablet 11   No facility-administered medications prior to visit.     PAST MEDICAL HISTORY: Past Medical History:  Diagnosis Date  . Acid reflux   . Anxiety    claustrophobia  . Chronic headaches   . COPD (chronic obstructive pulmonary disease) (HCC)   . Fracture    R heel hairline around 2007  . Fracture 1974   left arm s/p internal fixation  .  Frequent urinary tract infections   . Head injury    as a child x5   . Migraines   . Tobacco use      PAST SURGICAL HISTORY: Past Surgical History:  Procedure Laterality Date  . NO PAST SURGERIES       FAMILY HISTORY: Family History  Problem Relation Age of Onset  . Migraines Mother   . Colon cancer Mother   . Kidney cancer Mother        started here, small cell, metastastic, less than 6 months   . Diabetes Mother   . Heart disease Brother   . Cancer Brother   . Migraines Maternal Grandmother   . Stroke Maternal Grandmother   . Heart attack Maternal Grandmother   . Cancer Maternal Grandmother   . Emphysema Maternal Grandmother   . COPD Maternal Grandmother   . Migraines Other        on maternal side   . Migraines Nephew      SOCIAL HISTORY: Social History    Socioeconomic History  . Marital status: Single    Spouse name: Not on file  . Number of children: 1  . Years of education: Not on file  . Highest education level: Not on file  Occupational History  . Not on file  Tobacco Use  . Smoking status: Current Every Day Smoker    Packs/day: 1.00    Types: Cigarettes  . Smokeless tobacco: Never Used  Substance and Sexual Activity  . Alcohol use: Yes    Comment: occasional glass of wine  . Drug use: Not Currently    Comment: used weed in her 9320s   . Sexual activity: Not on file  Other Topics Concern  . Not on file  Social History Narrative   Lives alone   Caffeine: 2 large cups per day    Social Determinants of Health   Financial Resource Strain:   . Difficulty of Paying Living Expenses: Not on file  Food Insecurity:   . Worried About Programme researcher, broadcasting/film/videounning Out of Food in the Last Year: Not on file  . Ran Out of Food in the Last Year: Not on file  Transportation Needs:   . Lack of Transportation (Medical): Not on file  . Lack of Transportation (Non-Medical): Not on file  Physical Activity:   . Days of Exercise per Week: Not on file  . Minutes of Exercise per Session: Not on file  Stress:   . Feeling of Stress : Not on file  Social Connections:   . Frequency of Communication with Friends and Family: Not on file  . Frequency of Social Gatherings with Friends and Family: Not on file  . Attends Religious Services: Not on file  . Active Member of Clubs or Organizations: Not on file  . Attends BankerClub or Organization Meetings: Not on file  . Marital Status: Not on file  Intimate Partner Violence:   . Fear of Current or Ex-Partner: Not on file  . Emotionally Abused: Not on file  . Physically Abused: Not on file  . Sexually Abused: Not on file      PHYSICAL EXAM  Vitals:   01/01/20 0913  BP: (!) 161/87  Pulse: 73  Weight: 199 lb (90.3 kg)  Height: 5\' 2"  (1.575 m)   Body mass index is 36.4 kg/m.   Generalized: Well developed, in  no acute distress   Neurological examination  Mentation: Alert oriented to time, place, history taking. Follows all commands speech and language fluent  Cranial nerve II-XII: Pupils were equal round reactive to light. Extraocular movements were full, visual field were full  Motor: The motor testing reveals 5 over 5 strength of all 4 extremities. Good symmetric motor tone is noted throughout.  Gait and station: Gait is normal.    DIAGNOSTIC DATA (LABS, IMAGING, TESTING) - I reviewed patient records, labs, notes, testing and imaging myself where available.  Lab Results  Component Value Date   WBC 7.5 09/06/2019   HGB 14.4 09/06/2019   HCT 43.0 09/06/2019   MCV 88 09/06/2019   PLT 286 09/06/2019      Component Value Date/Time   NA 139 09/06/2019 1022   K 5.0 09/06/2019 1022   CL 101 09/06/2019 1022   CO2 26 09/06/2019 1022   GLUCOSE 87 09/06/2019 1022   BUN 7 09/06/2019 1022   CREATININE 0.52 (L) 09/06/2019 1022   CALCIUM 9.4 09/06/2019 1022   PROT 6.9 09/06/2019 1022   ALBUMIN 4.3 09/06/2019 1022   AST 20 09/06/2019 1022   ALT 17 09/06/2019 1022   ALKPHOS 90 09/06/2019 1022   BILITOT 0.5 09/06/2019 1022   GFRNONAA 111 09/06/2019 1022   GFRAA 128 09/06/2019 1022   No results found for: CHOL, HDL, LDLCALC, LDLDIRECT, TRIG, CHOLHDL No results found for: RJJO8C No results found for: VITAMINB12 Lab Results  Component Value Date   TSH 1.940 09/06/2019      ASSESSMENT AND PLAN  54 y.o. year old female  has a past medical history of Acid reflux, Anxiety, Chronic headaches, COPD (chronic obstructive pulmonary disease) (HCC), Fracture, Fracture (1974), Frequent urinary tract infections, Head injury, Migraines, and Tobacco use. here with   Chronic migraine without aura without status migrainosus, not intractable  Nyajah reports improvement in headache and migraine intensity over the past 3 months. Gean [place] are well tolerated. She does continue to have near daily  tension headaches, frequently occurring in the morning. I feel etiology is most likely multifactorial. I have encouraged her to follow up closely with PCP. She will continue propranolol as directed by PCP. This will help with migraine prevention. She was encouraged to consider incorporating more water and less caffeine into her daily routine. Bai drinks have caffeine. We have discussed risks of untreated sleep apnea. She is adamant that she does not wish to have sleep study as she is not interested in treatment if diagnosed. I have given her additional information to review in AVS. She was encouraged to focus on regular exercise and well balanced meals. She will follow up with [place] in 1 year, sooner if needed. She verbalizes understanding and agreement with this plan.    I spent 20 minutes of face-to-face and non-face-to-face time with patient.  This included previsit chart review, lab review, study review, order entry, electronic health record documentation, patient education.    Shawnie Dapper, MSN, FNP-C 01/01/2020, 9:29 AM  [facility] [address] [place], [place] [address] [phone]  Made any corrections needed, and agree with history, physical, neuro exam,assessment and plan as stated.     Naomie Dean, MD [facility]

## 2020-06-20 ENCOUNTER — Encounter (HOSPITAL_COMMUNITY): Payer: Self-pay | Admitting: Orthopedic Surgery

## 2020-06-20 NOTE — Progress Notes (Signed)
    Chest x-ray - 06/07/2009 EKG -n/a  Stress Test - denies ECHO - denies Cardiac Cath - denies   Sleep Study - denies CPAP - no  Fasting Blood Sugar - N/A Checks Blood Sugar _N/A____ times a day  Blood Thinner Instructions:no Aspirin Instructions:no  ERAS Protcol - no  PRE-SURGERY Ensure or G2- no  COVID TEST- DOS   Anesthesia review:no

## 2020-06-20 NOTE — H&P (Signed)
Orthopaedic Trauma Service (OTS) Consult   Patient ID: Emily Holmes MRN: [id number] DOB/AGE: [birth year] 53 y.o.    HPI: Emily Holmes is an 55 y.o. right-hand-dominant female with medical history notable for migraines, COPD, nicotine dependence, GERD and anxiety who sustained a ground-level fall about a week ago at home.  Patient was seen at her local emergency room where she was found to have a left distal radius fracture.  She was seen and evaluated at her primary care doctor's office who ultimately made a referral to our office.  Patient was seen in the office on 06/19/2020.  We discussed with the patient risks and benefits of operative versus nonoperative treatment.  She is still working and does manual labor. After extensive discussion we felt that surgical intervention was warranted to provide her with the best outcome possible.  Patient is in agreement plan she presents today for ORIF of her left distal radius.  Anticipate outpatient procedure.  Patient has remote history of a left forearm fracture that occurred when she was years old.  Does not recall having a DEXA scan.  Not on any vitamin D supplementation  Past Medical History:  Diagnosis Date  . Acid reflux   . Anxiety    claustrophobia  . Chronic headaches   . COPD (chronic obstructive pulmonary disease) (HCC)   . Fracture    R heel hairline around 2007  . Fracture 1974   left arm s/p internal fixation  . Frequent urinary tract infections   . Head injury    as a child x5   . Migraines   . Tobacco use     Past Surgical History:  Procedure Laterality Date  . FRACTURE SURGERY     as a child  . NO PAST SURGERIES      Family History  Problem Relation Age of Onset  . Migraines Mother   . Colon cancer Mother   . Kidney cancer Mother        started here, small cell, metastastic, less than 6 months   . Diabetes Mother   . Heart disease Brother   . Cancer Brother   . Migraines Maternal Grandmother    . Stroke Maternal Grandmother   . Heart attack Maternal Grandmother   . Cancer Maternal Grandmother   . Emphysema Maternal Grandmother   . COPD Maternal Grandmother   . Migraines Other        on maternal side   . Migraines Nephew     Social History:  reports that she has been smoking cigarettes. She has been smoking about 1.00 pack per day. She has never used smokeless tobacco. She reports current alcohol use. She reports previous drug use.  Allergies:  Allergies  Allergen Reactions  . Codeine Hives and Itching    Inside and out   . Other     GLUCOSAMINE (has shellfish)- stomach upset STEROIDS- severe body aches and pains and it hurt to breathe WOOL- hives, itching   . Shellfish-Derived Products     Stomach upset  . Topiramate     Insomnia, loss of appetite  . Wellbutrin [Bupropion]     Worsening migraines    Medications:  I have reviewed the patient's current medications. Prior to Admission:  No medications prior to admission.   Current Meds  Medication Sig  . albuterol (VENTOLIN HFA) 108 (90 Base) MCG/ACT inhaler Inhale 2 puffs into the lungs 3 (three) times daily as needed for wheezing or shortness of  breath.  . Ascorbic Acid (VITAMIN C) 1000 MG tablet Take 1,000 mg by mouth daily.  . busPIRone (BUSPAR) 10 MG tablet Take 10 mg by mouth 2 (two) times daily.   . cetirizine (ZYRTEC) 10 MG tablet Take 10 mg by mouth daily.  . clobetasol cream (TEMOVATE) 0.05 % Apply 1 application topically daily as needed (Dry patch).  . Cranberry-Vitamin C-Vitamin E 140-100-3 MG-MG-UNIT CAPS Take 2 tablets by mouth daily. AZO  . Fremanezumab-vfrm (AJOVY) 225 MG/1.5ML SOAJ Inject 225 mg into the skin every 30 (thirty) days.  Marland Kitchen ibuprofen (ADVIL) 200 MG tablet Take 600 mg by mouth every 8 (eight) hours as needed for mild pain or moderate pain.  . meperidine (DEMEROL) 50 MG tablet Take 50 mg by mouth at bedtime.  Marland Kitchen omeprazole (PRILOSEC) 20 MG capsule Take 20 mg by mouth daily.  .  propranolol ER (INDERAL LA) 160 MG SR capsule Take 160 mg by mouth daily.  . SUMAtriptan (IMITREX) 50 MG tablet Take 25-50 mg by mouth 2 (two) times daily as needed for migraine.  Marland Kitchen Ubrogepant (UBRELVY) 100 MG TABS Take 100 mg by mouth every 2 (two) hours as needed. Maximum 200mg  a day. (Patient taking differently: Take 100 mg by mouth every 2 (two) hours as needed (migraine). Maximum 200mg  a day.)     No results found for this or any previous visit (from the past 48 hour(s)).  No results found.  Intake/Output    None      Review of Systems  Constitutional: Negative for chills and fever.  HENT: Negative for congestion and sore throat.   Eyes: Negative for blurred vision.  Respiratory: Negative for shortness of breath and wheezing.   Cardiovascular: Negative for chest pain and palpitations.  Gastrointestinal: Negative for abdominal pain, nausea and vomiting.  Musculoskeletal:       Left wrist pain  Neurological: Negative for dizziness, tingling and sensory change.  Psychiatric/Behavioral: Positive for substance abuse (Nicotine dependence).   There were no vitals taken for this visit. Physical Exam Constitutional:      General: She is not in acute distress.    Appearance: Normal appearance. She is normal weight.  HENT:     Head: Normocephalic and atraumatic.     Mouth/Throat:     Mouth: Mucous membranes are dry.  Eyes:     Extraocular Movements: Extraocular movements intact.  Cardiovascular:     Rate and Rhythm: Normal rate and regular rhythm.     Pulses: Normal pulses.     Heart sounds: Normal heart sounds.  Pulmonary:     Effort: Pulmonary effort is normal.     Breath sounds: Normal breath sounds.  Abdominal:     General: Bowel sounds are normal.  Musculoskeletal:     Cervical back: Normal range of motion.     Comments: Left upper extremity Sugar tong splint is in place, sling fitting appropriately Swelling is very well controlled to the digits and hand Radial,  ulnar, median nerve motor and sensory function intact AIN and PIN motor functions intact Good perfusion distally Extremity is warm No pain with passive stretch of digits Shoulder is unremarkable, no pain or instability.  Axillary nerve motor and sensory function intact No other acute findings into the upper extremity Splint will be removed in the operating room   Skin:    General: Skin is warm and dry.     Capillary Refill: Capillary refill takes less than 2 seconds.  Neurological:     Mental Status: She  is alert and oriented to person, place, and time.  Psychiatric:        Mood and Affect: Mood normal.        Behavior: Behavior normal.        Thought Content: Thought content normal.            Assessment/Plan:  55 year old female ground-level fall with left distal radius fracture with shortening and loss of volar tilt  -Left distal radius fracture  OR for ORIF  Risks and benefits reviewed with the patient.  She wished to proceed  Anticipate outpatient procedure  No lifting with left arm for 6 weeks  Splint x2 weeks and then convert to removable brace to begin range of motion exercises  - Pain management:  Titrate accordingly, multimodal  - Metabolic Bone Disease:  Fracture suggestive of fragility fracture.  Check basic vitamin D levels.  Would recommend bone density scan given mechanism and medical comorbidities   - Dispo:  OR for ORIF left distal radius  Discharge when stable from the PACU    Mearl Latin, PA-C [phone] (C) 06/20/2020, 3:52 PM  [facility] [address] [place] [address] [phone] Val [phone] (F)    After 5pm and on the weekends please log on to Amion, go to orthopaedics and the look under the [facility] Call for the provider(s) on call. You can also call our office at [phone] and then follow the prompts to be connected to the call team.

## 2020-06-21 ENCOUNTER — Ambulatory Visit (HOSPITAL_COMMUNITY): Payer: BC Managed Care – PPO | Admitting: Anesthesiology

## 2020-06-21 ENCOUNTER — Ambulatory Visit (HOSPITAL_COMMUNITY): Payer: BC Managed Care – PPO

## 2020-06-21 ENCOUNTER — Encounter (HOSPITAL_COMMUNITY): Payer: Self-pay | Admitting: Orthopedic Surgery

## 2020-06-21 ENCOUNTER — Ambulatory Visit (HOSPITAL_COMMUNITY)
Admission: RE | Admit: 2020-06-21 | Discharge: 2020-06-21 | Disposition: A | Discharge status: Home / Self Care | Payer: BC Managed Care – PPO | Attending: Orthopedic Surgery | Admitting: Orthopedic Surgery

## 2020-06-21 ENCOUNTER — Other Ambulatory Visit (HOSPITAL_COMMUNITY): Payer: Self-pay

## 2020-06-21 ENCOUNTER — Other Ambulatory Visit: Payer: Self-pay

## 2020-06-21 ENCOUNTER — Encounter (HOSPITAL_COMMUNITY)
Admission: RE | Disposition: A | Discharge status: Home / Self Care | Payer: Self-pay | Source: Home / Self Care | Attending: Orthopedic Surgery

## 2020-06-21 DIAGNOSIS — Z885 Allergy status to narcotic agent status: Secondary | ICD-10-CM | POA: Insufficient documentation

## 2020-06-21 DIAGNOSIS — Z79899 Other long term (current) drug therapy: Secondary | ICD-10-CM | POA: Insufficient documentation

## 2020-06-21 DIAGNOSIS — S52572A Other intraarticular fracture of lower end of left radius, initial encounter for closed fracture: Secondary | ICD-10-CM | POA: Diagnosis not present

## 2020-06-21 DIAGNOSIS — F17210 Nicotine dependence, cigarettes, uncomplicated: Secondary | ICD-10-CM | POA: Diagnosis not present

## 2020-06-21 DIAGNOSIS — Z419 Encounter for procedure for purposes other than remedying health state, unspecified: Secondary | ICD-10-CM

## 2020-06-21 DIAGNOSIS — E8889 Other specified metabolic disorders: Secondary | ICD-10-CM | POA: Diagnosis not present

## 2020-06-21 DIAGNOSIS — Y92009 Unspecified place in unspecified non-institutional (private) residence as the place of occurrence of the external cause: Secondary | ICD-10-CM | POA: Diagnosis not present

## 2020-06-21 DIAGNOSIS — Z20822 Contact with and (suspected) exposure to covid-19: Secondary | ICD-10-CM | POA: Insufficient documentation

## 2020-06-21 DIAGNOSIS — Z01818 Encounter for other preprocedural examination: Secondary | ICD-10-CM

## 2020-06-21 DIAGNOSIS — W1830XA Fall on same level, unspecified, initial encounter: Secondary | ICD-10-CM | POA: Insufficient documentation

## 2020-06-21 DIAGNOSIS — T148XXA Other injury of unspecified body region, initial encounter: Secondary | ICD-10-CM

## 2020-06-21 HISTORY — PX: OPEN REDUCTION INTERNAL FIXATION (ORIF) DISTAL RADIAL FRACTURE: SHX5989

## 2020-06-21 LAB — CBC WITH DIFFERENTIAL/PLATELET
Abs Immature Granulocytes: 0.03 10*3/uL (ref 0.00–0.07)
Basophils Absolute: 0 10*3/uL (ref 0.0–0.1)
Basophils Relative: 0 %
Eosinophils Absolute: 0.1 10*3/uL (ref 0.0–0.5)
Eosinophils Relative: 2 %
HCT: 45.1 % (ref 36.0–46.0)
Hemoglobin: 14.6 g/dL (ref 12.0–15.0)
Immature Granulocytes: 0 %
Lymphocytes Relative: 30 %
Lymphs Abs: 2 10*3/uL (ref 0.7–4.0)
MCH: 30.3 pg (ref 26.0–34.0)
MCHC: 32.4 g/dL (ref 30.0–36.0)
MCV: 93.6 fL (ref 80.0–100.0)
Monocytes Absolute: 0.5 10*3/uL (ref 0.1–1.0)
Monocytes Relative: 7 %
Neutro Abs: 4.1 10*3/uL (ref 1.7–7.7)
Neutrophils Relative %: 61 %
Platelets: 273 10*3/uL (ref 150–400)
RBC: 4.82 MIL/uL (ref 3.87–5.11)
RDW: 12.5 % (ref 11.5–15.5)
WBC: 6.8 10*3/uL (ref 4.0–10.5)
nRBC: 0 % (ref 0.0–0.2)

## 2020-06-21 LAB — PROTIME-INR
INR: 1 (ref 0.8–1.2)
Prothrombin Time: 13.4 seconds (ref 11.4–15.2)

## 2020-06-21 LAB — COMPREHENSIVE METABOLIC PANEL
ALT: 18 U/L (ref 0–44)
AST: 20 U/L (ref 15–41)
Albumin: 3.6 g/dL (ref 3.5–5.0)
Alkaline Phosphatase: 68 U/L (ref 38–126)
Anion gap: 5 (ref 5–15)
BUN: 7 mg/dL (ref 6–20)
CO2: 27 mmol/L (ref 22–32)
Calcium: 9 mg/dL (ref 8.9–10.3)
Chloride: 102 mmol/L (ref 98–111)
Creatinine, Ser: 0.51 mg/dL (ref 0.44–1.00)
GFR, Estimated: >60 mL/min (ref >60)
Glucose, Bld: 105 mg/dL — ABNORMAL HIGH (ref 70–99)
Potassium: 4 mmol/L (ref 3.5–5.1)
Sodium: 134 mmol/L — ABNORMAL LOW (ref 135–145)
Total Bilirubin: 0.7 mg/dL (ref 0.3–1.2)
Total Protein: 6.9 g/dL (ref 6.5–8.1)

## 2020-06-21 LAB — SURGICAL PCR SCREEN
MRSA, PCR: NEGATIVE
Staphylococcus aureus: NEGATIVE

## 2020-06-21 LAB — SARS CORONAVIRUS 2 BY RT PCR (HOSPITAL ORDER, PERFORMED IN ~~LOC~~ HOSPITAL LAB): SARS Coronavirus 2: NEGATIVE

## 2020-06-21 LAB — APTT: aPTT: 32 seconds (ref 24–36)

## 2020-06-21 LAB — VITAMIN D 25 HYDROXY (VIT D DEFICIENCY, FRACTURES): Vit D, 25-Hydroxy: 16.95 ng/mL — ABNORMAL LOW (ref 30–100)

## 2020-06-21 SURGERY — OPEN REDUCTION INTERNAL FIXATION (ORIF) DISTAL RADIUS FRACTURE
Anesthesia: Monitor Anesthesia Care | Site: Arm Lower | Laterality: Left

## 2020-06-21 MED ORDER — FENTANYL CITRATE (PF) 100 MCG/2ML IJ SOLN
INTRAMUSCULAR | Status: AC
  Administered 2020-06-21: 100 ug via INTRAVENOUS
  Filled 2020-06-21: qty 2

## 2020-06-21 MED ORDER — CHLORHEXIDINE GLUCONATE 0.12 % MT SOLN
OROMUCOSAL | Status: AC
  Administered 2020-06-21: 15 mL via OROMUCOSAL
  Filled 2020-06-21: qty 15

## 2020-06-21 MED ORDER — FENTANYL CITRATE (PF) 100 MCG/2ML IJ SOLN: 100.0000 ug | Freq: Once | INTRAMUSCULAR | Status: AC

## 2020-06-21 MED ORDER — LIDOCAINE 2% (20 MG/ML) 5 ML SYRINGE
INTRAMUSCULAR | Status: DC | PRN
  Administered 2020-06-21: 60 mg via INTRAVENOUS

## 2020-06-21 MED ORDER — PROPOFOL 1000 MG/100ML IV EMUL
INTRAVENOUS | Status: AC
  Filled 2020-06-21: qty 100

## 2020-06-21 MED ORDER — CEFAZOLIN SODIUM-DEXTROSE 2-4 GM/100ML-% IV SOLN
2.0000 g | INTRAVENOUS | Status: AC
  Administered 2020-06-21: 2 g via INTRAVENOUS
  Filled 2020-06-21: qty 100

## 2020-06-21 MED ORDER — 0.9 % SODIUM CHLORIDE (POUR BTL) OPTIME
TOPICAL | Status: DC | PRN
  Administered 2020-06-21: 1000 mL

## 2020-06-21 MED ORDER — ORAL CARE MOUTH RINSE: 15.0000 mL | Freq: Once | OROMUCOSAL | Status: AC

## 2020-06-21 MED ORDER — PROPOFOL 500 MG/50ML IV EMUL
INTRAVENOUS | Status: DC | PRN
  Administered 2020-06-21: 100 ug/kg/min via INTRAVENOUS

## 2020-06-21 MED ORDER — CHLORHEXIDINE GLUCONATE 0.12 % MT SOLN: 15.0000 mL | Freq: Once | OROMUCOSAL | Status: AC

## 2020-06-21 MED ORDER — ONDANSETRON 4 MG PO TBDP
4.0000 mg | ORAL_TABLET | Freq: Three times a day (TID) | ORAL | 0 refills | Status: DC | PRN
  Filled 2020-06-21: qty 20, 7d supply, fill #0

## 2020-06-21 MED ORDER — KETAMINE HCL 10 MG/ML IJ SOLN
INTRAMUSCULAR | Status: DC | PRN
  Administered 2020-06-21: 15 mg via INTRAVENOUS

## 2020-06-21 MED ORDER — HYDROCODONE-ACETAMINOPHEN 7.5-325 MG PO TABS
1.0000 | ORAL_TABLET | ORAL | 0 refills | Status: DC | PRN
  Filled 2020-06-21: qty 42, 7d supply, fill #0

## 2020-06-21 MED ORDER — ROPIVACAINE HCL 5 MG/ML IJ SOLN
INTRAMUSCULAR | Status: DC | PRN
  Administered 2020-06-21: 30 mL via PERINEURAL

## 2020-06-21 MED ORDER — DEXAMETHASONE SODIUM PHOSPHATE 10 MG/ML IJ SOLN
INTRAMUSCULAR | Status: DC | PRN
  Administered 2020-06-21: 5 mg

## 2020-06-21 MED ORDER — MIDAZOLAM HCL 2 MG/2ML IJ SOLN: 2.0000 mg | Freq: Once | INTRAMUSCULAR | Status: AC

## 2020-06-21 MED ORDER — ACETAMINOPHEN 500 MG PO TABS
500.0000 mg | ORAL_TABLET | Freq: Three times a day (TID) | ORAL | 1 refills | Status: DC
  Filled 2020-06-21: qty 30, 10d supply, fill #0

## 2020-06-21 MED ORDER — CHOLECALCIFEROL 125 MCG (5000 UT) PO TABS
ORAL_TABLET | Freq: Every day | ORAL | 6 refills | Status: AC
  Filled 2020-06-21: qty 30, 30d supply, fill #0

## 2020-06-21 MED ORDER — METHOCARBAMOL 500 MG PO TABS
500.0000 mg | ORAL_TABLET | Freq: Three times a day (TID) | ORAL | 0 refills | Status: DC | PRN
  Filled 2020-06-21: qty 50, 9d supply, fill #0

## 2020-06-21 MED ORDER — POVIDONE-IODINE 10 % EX SWAB
2.0000 "application " | Freq: Once | CUTANEOUS | Status: AC
  Administered 2020-06-21: 2 via TOPICAL

## 2020-06-21 MED ORDER — MIDAZOLAM HCL 2 MG/2ML IJ SOLN
INTRAMUSCULAR | Status: AC
  Administered 2020-06-21: 2 mg via INTRAVENOUS
  Filled 2020-06-21: qty 2

## 2020-06-21 MED ORDER — KETAMINE HCL 50 MG/5ML IJ SOSY
PREFILLED_SYRINGE | INTRAMUSCULAR | Status: AC
  Filled 2020-06-21: qty 5

## 2020-06-21 MED ORDER — LACTATED RINGERS IV SOLN: INTRAVENOUS | Status: DC

## 2020-06-21 MED ORDER — CHLORHEXIDINE GLUCONATE 4 % EX LIQD: 60.0000 mL | Freq: Once | CUTANEOUS | Status: DC

## 2020-06-21 SURGICAL SUPPLY — 61 items
BIT DRILL 2.2 SS TIBIAL (BIT) ×2 IMPLANT
BNDG ELASTIC 2X5.8 VLCR STR LF (GAUZE/BANDAGES/DRESSINGS) ×2 IMPLANT
BNDG ELASTIC 3X5.8 VLCR STR LF (GAUZE/BANDAGES/DRESSINGS) ×2 IMPLANT
BNDG ELASTIC 4X5.8 VLCR STR LF (GAUZE/BANDAGES/DRESSINGS) IMPLANT
BRUSH SCRUB EZ PLAIN DRY (MISCELLANEOUS) ×2 IMPLANT
CORD BIPOLAR FORCEPS 12FT (ELECTRODE) ×2 IMPLANT
COVER SURGICAL LIGHT HANDLE (MISCELLANEOUS) ×2 IMPLANT
DECANTER SPIKE VIAL GLASS SM (MISCELLANEOUS) IMPLANT
DRAPE C-ARM 42X72 X-RAY (DRAPES) ×2 IMPLANT
DRSG EMULSION OIL 3X3 NADH (GAUZE/BANDAGES/DRESSINGS) ×2 IMPLANT
ELECT REM PT RETURN 9FT ADLT (ELECTROSURGICAL) ×2
ELECTRODE REM PT RTRN 9FT ADLT (ELECTROSURGICAL) ×1 IMPLANT
GAUZE SPONGE 4X4 12PLY STRL (GAUZE/BANDAGES/DRESSINGS) ×2 IMPLANT
GLOVE BIO SURGEON STRL SZ7.5 (GLOVE) ×2 IMPLANT
GLOVE BIO SURGEON STRL SZ8 (GLOVE) ×2 IMPLANT
GLOVE BIOGEL PI IND STRL 7.5 (GLOVE) ×1 IMPLANT
GLOVE BIOGEL PI INDICATOR 7.5 (GLOVE) ×1
GLOVE SRG 8 PF TXTR STRL LF DI (GLOVE) ×1 IMPLANT
GLOVE SURG UNDER POLY LF SZ8 (GLOVE) ×2
GOWN STRL REUS W/ TWL LRG LVL3 (GOWN DISPOSABLE) ×2 IMPLANT
GOWN STRL REUS W/ TWL XL LVL3 (GOWN DISPOSABLE) ×1 IMPLANT
GOWN STRL REUS W/TWL LRG LVL3 (GOWN DISPOSABLE) ×4
GOWN STRL REUS W/TWL XL LVL3 (GOWN DISPOSABLE) ×2
K-WIRE 1.6 (WIRE) ×8
K-WIRE FX5X1.6XNS BN SS (WIRE) ×4
KIT BASIN OR (CUSTOM PROCEDURE TRAY) ×2 IMPLANT
KIT TURNOVER KIT B (KITS) ×2 IMPLANT
KWIRE FX5X1.6XNS BN SS (WIRE) ×4 IMPLANT
NEEDLE HYPO 25GX1X1/2 BEV (NEEDLE) IMPLANT
NS IRRIG 1000ML POUR BTL (IV SOLUTION) ×2 IMPLANT
PACK ORTHO EXTREMITY (CUSTOM PROCEDURE TRAY) ×2 IMPLANT
PAD ARMBOARD 7.5X6 YLW CONV (MISCELLANEOUS) ×4 IMPLANT
PAD CAST 3X4 CTTN HI CHSV (CAST SUPPLIES) IMPLANT
PADDING CAST ABS 3INX4YD NS (CAST SUPPLIES) ×1
PADDING CAST ABS COTTON 3X4 (CAST SUPPLIES) ×1 IMPLANT
PADDING CAST COTTON 2X4 NS (CAST SUPPLIES) ×2 IMPLANT
PADDING CAST COTTON 3X4 STRL (CAST SUPPLIES)
PEG LOCKING SMOOTH 2.2X16 (Screw) ×4 IMPLANT
PEG LOCKING SMOOTH 2.2X18 (Peg) ×6 IMPLANT
PEG LOCKING SMOOTH 2.2X20 (Screw) ×6 IMPLANT
PLATE STANDARD DVR LEFT (Plate) ×2 IMPLANT
PLATE STD DVR LT 24X51 (Plate) ×1 IMPLANT
SCREW LOCK 12X2.7X 3 LD (Screw) ×4 IMPLANT
SCREW LOCK 16X2.7X 3 LD TPR (Screw) ×1 IMPLANT
SCREW LOCKING 2.7X12MM (Screw) ×8 IMPLANT
SCREW LOCKING 2.7X13MM (Screw) ×2 IMPLANT
SCREW LOCKING 2.7X16 (Screw) ×2 IMPLANT
SLING ARM FOAM STRAP LRG (SOFTGOODS) ×2 IMPLANT
SPLINT PLASTER EXTRA FAST 3X15 (CAST SUPPLIES) ×1
SPLINT PLASTER GYPS XFAST 3X15 (CAST SUPPLIES) ×1 IMPLANT
SUCTION FRAZIER TIP 8 FR DISP (SUCTIONS) ×1
SUCTION TUBE FRAZIER 8FR DISP (SUCTIONS) ×1 IMPLANT
SUT ETHILON 3 0 PS 1 (SUTURE) ×4 IMPLANT
SUT VIC AB 0 CT3 27 (SUTURE) ×2 IMPLANT
SUT VIC AB 2-0 CT1 27 (SUTURE)
SUT VIC AB 2-0 CT1 TAPERPNT 27 (SUTURE) IMPLANT
SYR CONTROL 10ML LL (SYRINGE) IMPLANT
TOWEL GREEN STERILE (TOWEL DISPOSABLE) ×4 IMPLANT
TOWEL GREEN STERILE FF (TOWEL DISPOSABLE) ×2 IMPLANT
TUBE CONNECTING 12X1/4 (SUCTIONS) ×2 IMPLANT
UNDERPAD 30X36 HEAVY ABSORB (UNDERPADS AND DIAPERS) ×2 IMPLANT

## 2020-06-21 NOTE — Anesthesia Preprocedure Evaluation (Addendum)
Anesthesia Evaluation  Patient identified by MRN, date of birth, ID band Patient awake    Reviewed: Allergy & Precautions, NPO status , Patient's Chart, lab work & pertinent test results, reviewed documented beta blocker date and time   Airway Mallampati: III  TM Distance: >3 FB Neck ROM: Full    Dental  (+) Edentulous Upper, Edentulous Lower, Dental Advisory Given   Pulmonary COPD,  COPD inhaler, Current Smoker and Patient abstained from smoking.,    Pulmonary exam normal breath sounds clear to auscultation       Cardiovascular negative cardio ROS Normal cardiovascular exam Rhythm:Regular Rate:Normal     Neuro/Psych  Headaches, PSYCHIATRIC DISORDERS Anxiety    GI/Hepatic Neg liver ROS, GERD  Medicated and Controlled,  Endo/Other  negative endocrine ROS  Renal/GU negative Renal ROS  negative genitourinary   Musculoskeletal negative musculoskeletal ROS (+)   Abdominal   Peds  Hematology negative hematology ROS (+)   Anesthesia Other Findings   Reproductive/Obstetrics                            Anesthesia Physical Anesthesia Plan  ASA: II  Anesthesia Plan: MAC and Regional   Post-op Pain Management:  Regional for Post-op pain   Induction: Intravenous  PONV Risk Score and Plan: 1 and Propofol infusion, Treatment may vary due to age or medical condition, Midazolam and Ondansetron  Airway Management Planned: Natural Airway  Additional Equipment:   Intra-op Plan:   Post-operative Plan:   Informed Consent: I have reviewed the patients History and Physical, chart, labs and discussed the procedure including the risks, benefits and alternatives for the proposed anesthesia with the patient or authorized representative who has indicated his/her understanding and acceptance.     Dental advisory given  Plan Discussed with: CRNA  Anesthesia Plan Comments:         Anesthesia Quick  Evaluation

## 2020-06-21 NOTE — Progress Notes (Signed)
Patient ready for discharge. Waiting for medications from pharmacy.  Hermina Barters, RN \

## 2020-06-21 NOTE — Anesthesia Procedure Notes (Signed)
Anesthesia Regional Block: Supraclavicular block   Pre-Anesthetic Checklist: ,, timeout performed, Correct Patient, Correct Site, Correct Laterality, Correct Procedure, Correct Position, site marked, Risks and benefits discussed,  Surgical consent,  Pre-op evaluation,  At surgeon's request and post-op pain management  Laterality: Left  Prep: Maximum Sterile Barrier Precautions used, chloraprep       Needles:  Injection technique: Single-shot  Needle Type: Echogenic Stimulator Needle     Needle Length: 9cm  Needle Gauge: 22     Additional Needles:   Procedures:,,,, ultrasound used (permanent image in chart),,,,  Narrative:  Start time: 06/21/2020 8:00 AM End time: 06/21/2020 8:10 AM Injection made incrementally with aspirations every 5 mL.  Performed by: Personally  Anesthesiologist: Elmer Picker, MD  Additional Notes: Monitors applied. No increased pain on injection. No increased resistance to injection. Injection made in 5cc increments. Good needle visualization. Patient tolerated procedure well.

## 2020-06-21 NOTE — Op Note (Signed)
06/21/2020  9:49 AM  PATIENT:  Emily Holmes  55 y.o. female  PRE-OPERATIVE DIAGNOSIS:  FRACTURE LEFT DISTAL RADIUS WITH TWO INTRA-ARTICULAR FRAGMENTS  POST-OPERATIVE DIAGNOSIS:  FRACTURE LEFT DISTAL RADIUS WITH TWO INTRA-ARTICULAR FRAGMENTS  PROCEDURE:  Procedure(s): OPEN REDUCTION INTERNAL FIXATION (ORIF) DISTAL RADIAL FRACTURE (Left) WITH TWO INTRA-ARTICULAR FRAGMENTS  SURGEON:  Surgeon(s) and Role:    Altamese [place], MD - Primary  PHYSICIAN ASSISTANT: Ainsley Spinner, PA-C  ANESTHESIA:   Regional supplemented with MAC  I/O:  No intake/output data recorded.  SPECIMEN:  No Specimen  TOURNIQUET:  None.  COMPLICATIONS: NONE  DICTATION: .Note written in EPIC  DISPOSITION: TO PACU  CONDITION: STABLE  DELAY START OF DVT PROPHYLAXIS BECAUSE OF BLEEDING RISK: NO   BRIEF SUMMARY AND INDICATION FOR PROCEDURE:   Emily Holmes is a 55 y.o. who sustained a displaced, comminuted distal radius fracture.  There was both angulation as well as intra-articular fragmentation.  I did discuss with the patient the risks and benefits of surgical repair including the potential for arthritis, loss of motion, DVT, PE, symptomatic hardware, nerve injury, vessel injury, infection, and need for further surgery among others. After full discussion, the patient wished to proceed.   BRIEF SUMMARY OF PROCEDURE:  After administration of preoperative antibiotics and a successful regional block, the patient was taken to the operating room where sedation induced.  The upper extremity was prepped and draped in usual sterile fashion. I began with a longitudinal incision, zagging at the wrist flexion crease.  The FCR tendon sheath was exposed.  The tendon retracted radially and the deep portion of the tendon sheath incised.  The pronator was divided from the radial edge and this exposed the distal volar radius.  We were able to identify some of the fracture fragments, most of the comminution was  intra-articular and dorsal. A reduction maneuver was performed and held on a towel bump with improvement in alignment but it was inadequate to completely restore appropriate tilt and angulation. While holding this provisional reduction, the articular fragments and subchondral bone were elevated into congruence along the joint line and  Squeezed from radial and ulnar sides with baby Virgia Land and Hohman. This was reduced and pinned provisionally.  I then placed a series of K-wires and later PEG fixation into the epiphysis.  I then checked plate position and reduction at the articular surface.  I brought the plate down to the metaphysis and secured it with standard screws which completed restoration of tilt, which appeared to be anatomic.  This was followed by additional screws in the metaphysis.  Final reconstruction showed excellent articular congruity with restoration of radial inclination, height, and tilt.  All screws appeared to be of appropriate length.  The wound was irrigated copiously.  The pronator tagged back along the radial edge and then the subcu with 2-0 Vicryl and the skin with nylon.  Sterile gently compressive dressing and volar splint was then applied with the patient's hand and wrist in neutral position. Ainsley Spinner, PAC, assisted me throughout with retraction, reduction, and closure.   PROGNOSIS: The patient will have unrestricted range of motion of the elbow and return to the office in 10-14 days for removal of sutures. Ice and elevate with hand above the elbow and elbow above the heart. At that time, we will likely convert into a removable splint.          Astrid Divine. Marcelino Scot, M.D.

## 2020-06-21 NOTE — Discharge Instructions (Signed)
Orthopaedic Trauma Service Discharge Instructions   General Discharge Instructions   WEIGHT BEARING STATUS: Nonweightbearing Left upper extremity, no lifting, pushing or pulling with Left wrist or hand  RANGE OF MOTION/ACTIVITY: unrestricted range of motion of fingers and elbow  Bone health: labs show vitamin d deficiency. Take supplements that have been prescribed   Wound Care: do not remove splint, keep splint clean and dry. We will remove at first post op follow up appointment   Diet: as you were eating previously.  Can use over the counter stool softeners and bowel preparations, such as Miralax, to help with bowel movements.  Narcotics can be constipating.  Be sure to drink plenty of fluids  PAIN MEDICATION USE AND EXPECTATIONS  You have likely been given narcotic medications to help control your pain.  After a traumatic event that results in an fracture (broken bone) with or without surgery, it is ok to use narcotic pain medications to help control one's pain.  We understand that everyone responds to pain differently and each individual patient will be evaluated on a regular basis for the continued need for narcotic medications. Ideally, narcotic medication use should last no more than 6-8 weeks (coinciding with fracture healing).   As a patient it is your responsibility as well to monitor narcotic medication use and report the amount and frequency you use these medications when you come to your office visit.   We would also advise that if you are using narcotic medications, you should take a dose prior to therapy to maximize you participation.  IF YOU ARE ON NARCOTIC MEDICATIONS IT IS NOT PERMISSIBLE TO OPERATE A MOTOR VEHICLE (MOTORCYCLE/CAR/TRUCK/MOPED) OR HEAVY MACHINERY DO NOT MIX NARCOTICS WITH OTHER CNS (CENTRAL NERVOUS SYSTEM) DEPRESSANTS SUCH AS ALCOHOL   STOP SMOKING OR USING NICOTINE PRODUCTS!!!!  As discussed nicotine severely impairs your body's ability to heal  surgical and traumatic wounds but also impairs bone healing.  Wounds and bone heal by forming microscopic blood vessels (angiogenesis) and nicotine is a vasoconstrictor (essentially, shrinks blood vessels).  Therefore, if vasoconstriction occurs to these microscopic blood vessels they essentially disappear and are unable to deliver necessary nutrients to the healing tissue.  This is one modifiable factor that you can do to dramatically increase your chances of healing your injury.    (This means no smoking, no nicotine gum, patches, etc)  DO NOT USE NONSTEROIDAL ANTI-INFLAMMATORY DRUGS (NSAID'S)  Using products such as Advil (ibuprofen), Aleve (naproxen), Motrin (ibuprofen) for additional pain control during fracture healing can delay and/or prevent the healing response.  If you would like to take over the counter (OTC) medication, Tylenol (acetaminophen) is ok.  However, some narcotic medications that are given for pain control contain acetaminophen as well. Therefore, you should not exceed more than 4000 mg of tylenol in a day if you do not have liver disease.  Also note that there are may OTC medicines, such as cold medicines and allergy medicines that my contain tylenol as well.  If you have any questions about medications and/or interactions please ask your doctor/PA or your pharmacist.      ICE AND ELEVATE INJURED/OPERATIVE EXTREMITY  Using ice and elevating the injured extremity above your heart can help with swelling and pain control.  Icing in a pulsatile fashion, such as 20 minutes on and 20 minutes off, can be followed.    Do not place ice directly on skin. Make sure there is a barrier between to skin and the ice pack.  Using frozen items such as frozen peas works well as the conform nicely to the are that needs to be iced.  USE AN ACE WRAP OR TED HOSE FOR SWELLING CONTROL  In addition to icing and elevation, Ace wraps or TED hose are used to help limit and resolve swelling.  It is  recommended to use Ace wraps or TED hose until you are informed to stop.    When using Ace Wraps start the wrapping distally (farthest away from the body) and wrap proximally (closer to the body)   Example: If you had surgery on your leg or thing and you do not have a splint on, start the ace wrap at the toes and work your way up to the thigh        If you had surgery on your upper extremity and do not have a splint on, start the ace wrap at your fingers and work your way up to the upper arm  IF YOU ARE IN A SPLINT OR CAST DO NOT REMOVE IT FOR ANY REASON   If your splint gets wet for any reason please contact the office immediately. You may shower in your splint or cast as long as you keep it dry.  This can be done by wrapping in a cast cover or garbage back (or similar)  Do Not stick any thing down your splint or cast such as pencils, money, or hangers to try and scratch yourself with.  If you feel itchy take benadryl as prescribed on the bottle for itching  IF YOU ARE IN A CAM BOOT (BLACK BOOT)  You may remove boot periodically. Perform daily dressing changes as noted below.  Wash the liner of the boot regularly and wear a sock when wearing the boot. It is recommended that you sleep in the boot until told otherwise    Call office for the following:  Temperature greater than 101F  Persistent nausea and vomiting  Severe uncontrolled pain  Redness, tenderness, or signs of infection (pain, swelling, redness, odor or green/yellow discharge around the site)  Difficulty breathing, headache or visual disturbances  Hives  Persistent dizziness or light-headedness  Extreme fatigue  Any other questions or concerns you may have after discharge  In an emergency, call 911 or go to an Emergency Department at a nearby hospital  HELPFUL INFORMATION  ? If you had a block, it will wear off between 8-24 hrs postop typically.  This is period when your pain may go from nearly zero to the pain you  would have had postop without the block.  This is an abrupt transition but nothing dangerous is happening.  You may take an extra dose of narcotic when this happens.  ? You should wean off your narcotic medicines as soon as you are able.  Most patients will be off or using minimal narcotics before their first postop appointment.   ? We suggest you use the pain medication the first night prior to going to bed, in order to ease any pain when the anesthesia wears off. You should avoid taking pain medications on an empty stomach as it will make you nauseous.  ? Do not drink alcoholic beverages or take illicit drugs when taking pain medications.  ? In most states it is against the law to drive while you are in a splint or sling.  And certainly against the law to drive while taking narcotics.  ? You may return to work/school in the next couple of days when  you feel up to it.   ? Pain medication may make you constipated.  Below are a few solutions to try in this order: - Decrease the amount of pain medication if you aren't having pain. - Drink lots of decaffeinated fluids. - Drink prune juice and/or each dried prunes  o If the first 3 don't work start with additional solutions - Take Colace - an over-the-counter stool softener - Take Senokot - an over-the-counter laxative - Take Miralax - a stronger over-the-counter laxative     CALL THE OFFICE WITH ANY QUESTIONS OR CONCERNS: [phone]   VISIT OUR WEBSITE FOR ADDITIONAL INFORMATION: [email or web address]

## 2020-06-21 NOTE — Transfer of Care (Signed)
Immediate Anesthesia Transfer of Care Note  Patient: Emily Holmes  Procedure(s) Performed: OPEN REDUCTION INTERNAL FIXATION (ORIF) DISTAL RADIAL FRACTURE (Left Arm Lower)  Patient Location: PACU  Anesthesia Type:MAC and Regional  Level of Consciousness: awake  Airway & Oxygen Therapy: Patient Spontanous Breathing and Patient connected to face mask oxygen  Post-op Assessment: Report given to RN and Post -op Vital signs reviewed and stable  Post vital signs: Reviewed and stable  Last Vitals:  Vitals Value Taken Time  BP 127/82 06/21/20 1142  Temp    Pulse 72 06/21/20 1150  Resp 20 06/21/20 1150  SpO2 92 % 06/21/20 1150  Vitals shown include unvalidated device data.  Last Pain:  Vitals:   06/21/20 0713  PainSc: 5          Complications: No complications documented.

## 2020-06-21 NOTE — Anesthesia Postprocedure Evaluation (Signed)
Anesthesia Post Note  Patient: Emily Holmes  Procedure(s) Performed: OPEN REDUCTION INTERNAL FIXATION (ORIF) DISTAL RADIAL FRACTURE (Left Arm Lower)     Patient location during evaluation: PACU Anesthesia Type: Regional and MAC Level of consciousness: awake and alert Pain management: pain level controlled Vital Signs Assessment: post-procedure vital signs reviewed and stable Respiratory status: spontaneous breathing, nonlabored ventilation, respiratory function stable and patient connected to nasal cannula oxygen Cardiovascular status: stable and blood pressure returned to baseline Postop Assessment: no apparent nausea or vomiting Anesthetic complications: no   No complications documented.  Last Vitals:  Vitals:   06/21/20 1140 06/21/20 1155  BP: 127/82 111/63  Pulse:  77  Resp: (!) 22 20  Temp:  36.9 C  SpO2:  93%    Last Pain:  Vitals:   06/21/20 1155  PainSc: 0-No pain   Pain Goal:                   Enis Riecke COKER

## 2020-06-24 ENCOUNTER — Encounter (HOSPITAL_COMMUNITY): Payer: Self-pay | Admitting: Orthopedic Surgery

## 2020-10-01 ENCOUNTER — Other Ambulatory Visit: Payer: Self-pay | Admitting: Neurology

## 2020-12-31 ENCOUNTER — Ambulatory Visit: Payer: BC Managed Care – PPO | Admitting: Family Medicine

## 2021-01-13 IMAGING — MG DIGITAL SCREENING BILAT W/ TOMO W/ CAD
8 series · 8 of 24 positions shown · non-contrast
Comparison: None.

CLINICAL DATA: Screening.

EXAM:
DIGITAL SCREENING BILATERAL MAMMOGRAM WITH TOMO AND CAD

[L MLO synth-2D]
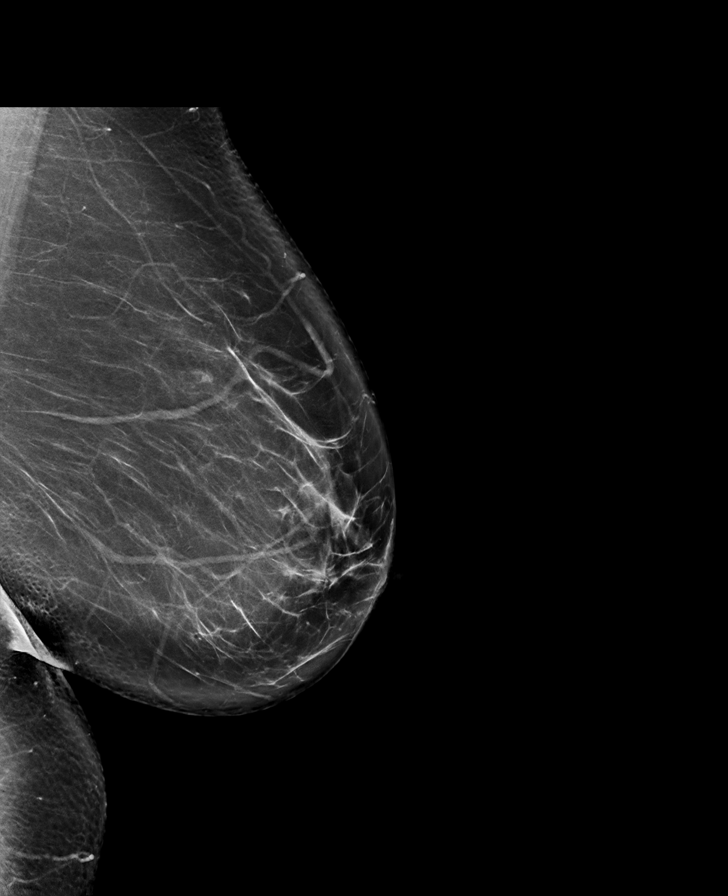

[R MLO synth-2D]
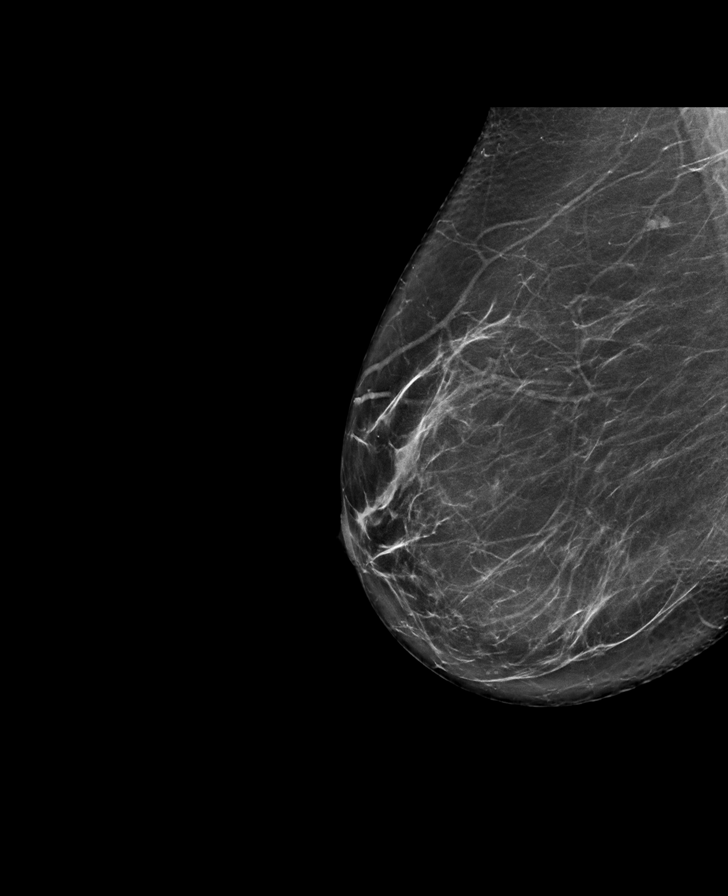

[L CC synth-2D]
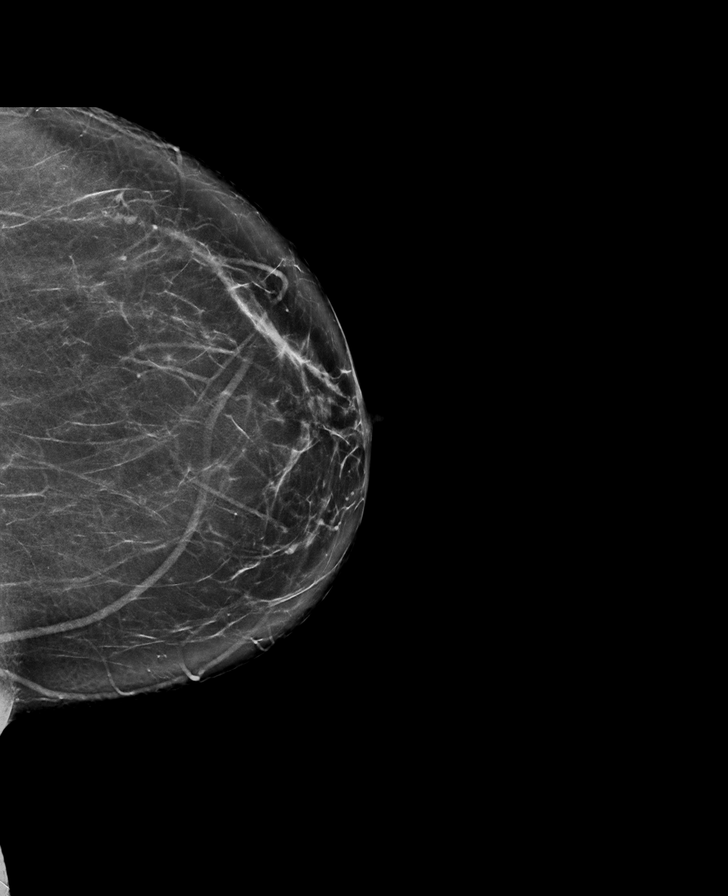

[R CC synth-2D]
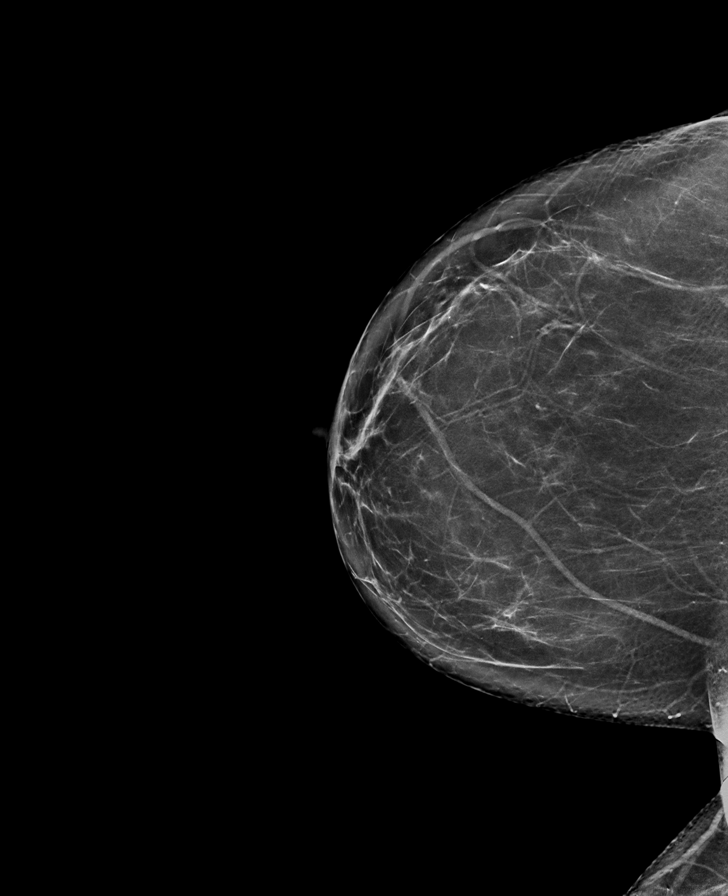

[L CC tomo · tomo slice 39/78.0]
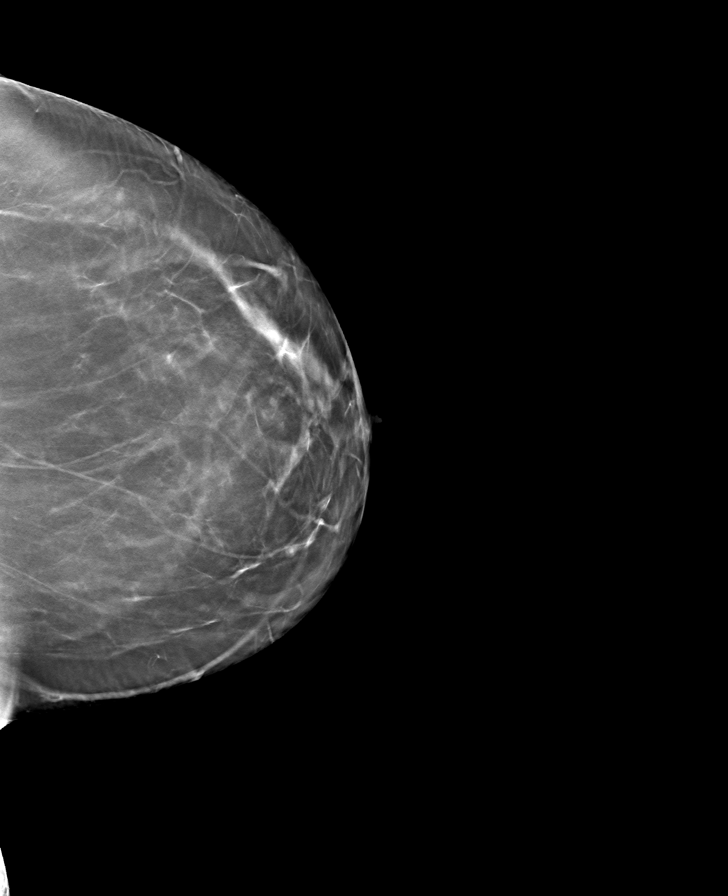

[R MLO tomo · tomo slice 39/77.0]
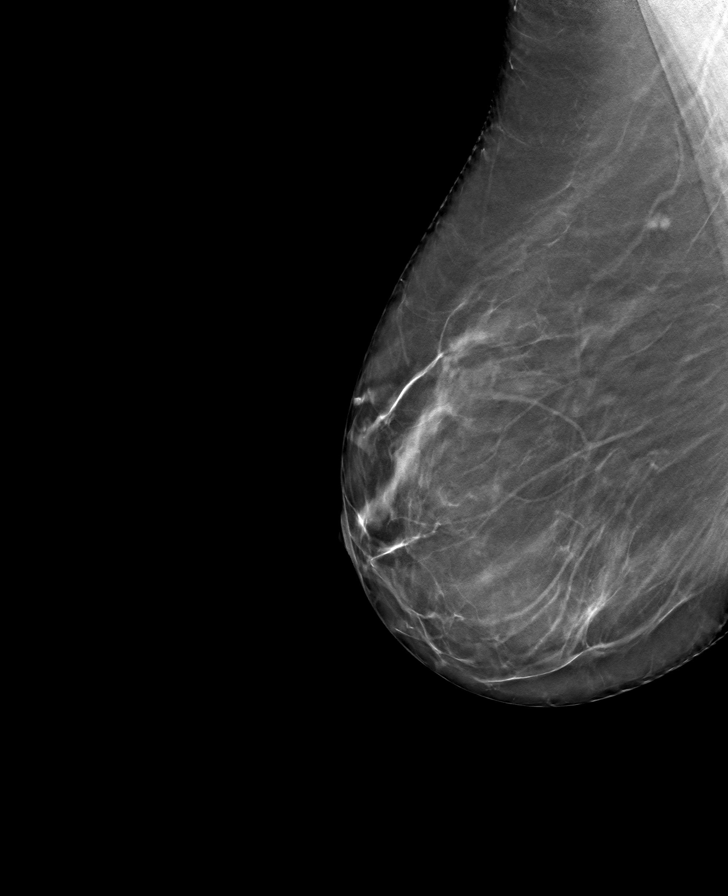

[R CC tomo · tomo slice 38/75.0]
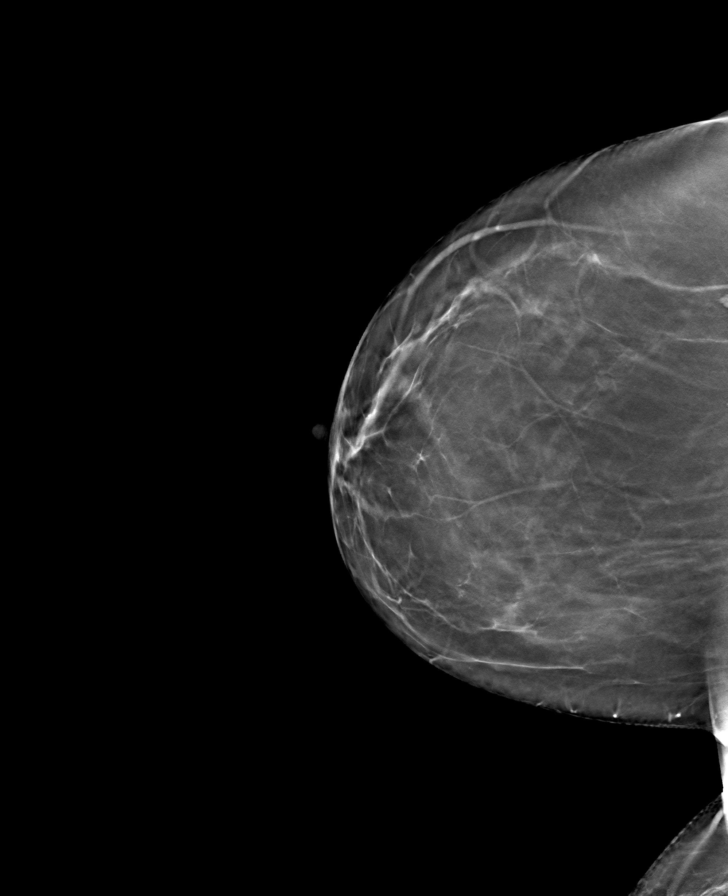

[L MLO tomo · tomo slice 44/87.0]
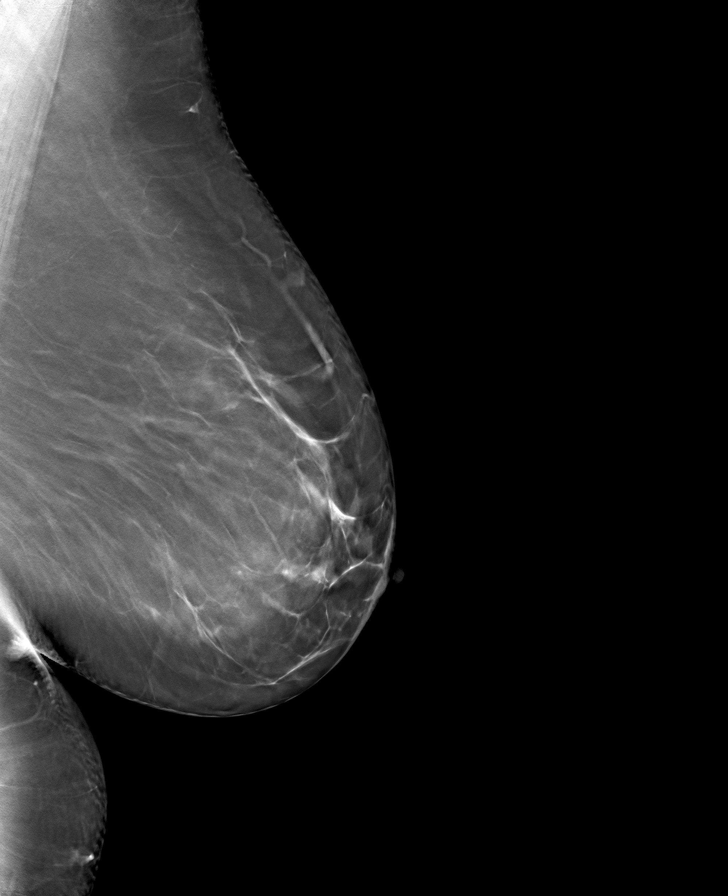

[8 of 24 positions shown; findings below may reference images not displayed]

ACR Breast Density Category b: There are scattered areas of
fibroglandular density.
FINDINGS: There are no findings suspicious for malignancy. Images were
processed with CAD.
IMPRESSION: No mammographic evidence of malignancy. A result letter of this
screening mammogram will be mailed directly to the patient.

RECOMMENDATION:
Screening mammogram in one year. (Code:[id number])

BI-RADS CATEGORY  1: Negative.

## 2021-03-18 NOTE — Progress Notes (Signed)
Chief Complaint  Patient presents with   Follow-up    Pt alone, rm 16. Pt states that she is had reactions to Ajovy. Stopped in aug. It was a rash at the site/itched at the site and it seemed to be worse. Headaches are still present but have lessened some.     HISTORY OF PRESENT ILLNESS: 03/19/21 ALL: Emily Holmes returns for follow up for migraines. She tried Ajovy but discontinued in August 2022 due to skin reactions. She felt that it helped headaches for a a little while but lost effectiveness. She is also taking propranolol ER 160mg  daily prescribed by PCP for BP management. She has some sort of headache every day. She cant remember a day without one. Most are fairly mild. She has about 2-3 migrainous headaches a month. Roselyn Meier does help with migraine abortion.   She reports having HST a few months ago. She wake multiple times trying to pull off leads. She reports provider recommended CPAP but only had 4 hour data. She does not feel she can tolerate CPAP. She refuses dental appliance. Not candidate for Inspire at this time. She has elevated head of bed and feels she sleeps better. Continues to have morning headaches. BP is usually "normal".   Tried and Failed: Ajovy (skin reaction), Wellbutrin and topiramate, propranolol, steroids, sumatriptan  01/01/2020 ALL: Emily Holmes is a 56 y.o. female here today for follow up for migraines. She was started on [place] in 08/2019. She also continues propranolol LA 160mg  daily. She feels that injections have helped with daily tension headaches. She continues to have near daily tension style headaches that are not as severe. Roselyn Meier has helped reduce severity of migraines but does not abort. She reports 2-3 migraines per month. She gets a migraine with every rain storm. She has never repeated Ubrelvy dose. She seems to have morning headaches often but states they can be worse in the evenings. BP 161/87 today. Last office reading 122/82. Blood  pressure is usually "good" and it is only elevated when she has a migraine. She is unable to tell me a specific readings. She has a significant family history of HTN. She is followed by PCP every 6 months. Her mom had OSA. She does not wish to pursue sleep study. She knows that she will not use CPAP. She is not interested in considering other treatments for OSA. She drinks 2-3 Bai drinks daily. She does not drink regular water. She was previously advised to consider Botox but she states insurance will not cover it.    HISTORY (copied from Dr Cathren Laine note on 09/06/2019)  HPI:  Emily Holmes is a 56 y.o. female here as requested by Carolee Rota, NP for migraines.  I reviewed Emily Holmes notes: Past medical history migraines, anxiety, stress incontinence, mixed type COPD with acute bronchitis, adnexal fullness and tenderness.  Medications that she has tried that could be used in migraine management include Wellbutrin and topiramate, steroids, sumatriptan, propranolol.  She has apparently been to a headache clinic before but told Carolee Rota that she was not impressed with them.  At last appointment she reported increased frequency, lately getting headaches about once a week, episode lasting 1 to 3 days, headaches causing vision changes, vertigo, nausea, pressure behind the eyes, body temp changes, associated light and sound sensitivity, head feels like someone is driving red hot ice packs, barb wire wrapped around her head, she has also tried Fioricet, B complex, feverfew, riboflavin and magnesium, propranolol.  Recent labs  include CMP collected June 01, 2019, essentially normal, BUN 7, creatinine 0.48.  Imitrex makes her sleepy, in the past they had discussed Elavil and Aimovig or other injectables, ibuprofen when she develops migraines, significant weather seems to bring on migraines as well, she has associated visual disturbance, nausea, tinnitus and dizziness, she apparently has FMLA where she is  limited to 10 hours a day due to her migraines, she can only work 6 days in a row, she definitely noted more headaches with working many days in a row such as 21 which she reports her work has asked of her.  I reviewed records all the way through 2017, she is also stated headaches are severe 7 out of 10.   meds tried: Wellbutrin and topiramate, steroids, sumatriptan, propranolol ,topiramate, fioricet, b complex, feverfew, riboflavin, magnesium,    Patient states migraines since childhood, mother/nephew/sister/grandmother; extensive family history f migraines. She has had 5 head injuries in childhood unsure if related. Migraines started worsening since early menopause in 45s and slowly progressively worsening, over the last year or longer she has daily headaches, she wakes up with daily headaches and we discussed a sleep study and she declines. She does not sleep well. Migraines cause disequilibrium, vertigo, vision changes can't see the words on the past, can be unilateral or start both sides, from the back, pulsating/pounding/thrbbing.ice picks, nausea, light/sound sensitivity, movement make it worse, laying down in a black out room helps, weather barometric pressure makes it worse. She has vertigo, half her body gets cold even without the headache and happens with the headache pain. She has ringing in her ears not pulsatile, no aura, no medication overuse,also feels aband around the head and severe pressure,  ibuprofen doesn't really help. No other focal neurologic deficits, associated symptoms, inciting events or modifiable factors.   Reviewed notes, labs and imaging from outside physicians, which showed: see above   She had labs this past march, will request results do not have them   Reviewed MRI of th ebrain report:   IMPRESSION:  1.  No acute intracranial abnormality.  2.  At least a single small discrete white matter lesion in both external  capsules. These lesions are abnormal but  nonspecific, usually resulting  from benign/remote/incidental causes (e.g. Prior  trauma/inflammation/demyelinization, or chronic ischemia associated with  migraine/atherosclerosis/other vasculopathies processes). Favor mild  chronic ischemic/age-related changes      REVIEW OF SYSTEMS: Out of a complete 14 system review of symptoms, the patient complains only of the following symptoms, daily headaches, sinus congestion, and all other reviewed systems are negative.   ALLERGIES: Allergies  Allergen Reactions   Codeine Hives and Itching    Inside and out    Other     GLUCOSAMINE (has shellfish)- stomach upset STEROIDS- severe body aches and pains and it hurt to breathe WOOL- hives, itching    Shellfish-Derived Products     Stomach upset   Topiramate     Insomnia, loss of appetite   Wellbutrin [Bupropion]     Worsening migraines     HOME MEDICATIONS: Outpatient Medications Prior to Visit  Medication Sig Dispense Refill   albuterol (VENTOLIN HFA) 108 (90 Base) MCG/ACT inhaler Inhale 2 puffs into the lungs 3 (three) times daily as needed for wheezing or shortness of breath.     Ascorbic Acid (VITAMIN C) 1000 MG tablet Take 1,000 mg by mouth daily.     busPIRone (BUSPAR) 10 MG tablet Take 10 mg by mouth 2 (two) times daily.  cetirizine (ZYRTEC) 10 MG tablet Take 10 mg by mouth daily.     Cholecalciferol 125 MCG (5000 UT) TABS Take by mouth daily. 30 tablet 6   clobetasol cream (TEMOVATE) [id number] % Apply 1 application topically daily as needed (Dry patch).     Cranberry-Vitamin C-Vitamin E 140-100-3 MG-MG-UNIT CAPS Take 2 tablets by mouth daily. AZO     omeprazole (PRILOSEC) 20 MG capsule Take 20 mg by mouth daily.     propranolol ER (INDERAL LA) 160 MG SR capsule Take 160 mg by mouth daily.     SUMAtriptan (IMITREX) 50 MG tablet Take 25-50 mg by mouth 2 (two) times daily as needed for migraine.     Ubrogepant (UBRELVY) 100 MG TABS Take 100 mg by mouth every 2 (two) hours as  needed. Maximum 200mg  a day. (Patient taking differently: Take 100 mg by mouth every 2 (two) hours as needed (migraine). Maximum 200mg  a day.) 9 tablet 11   acetaminophen (TYLENOL) 500 MG tablet Take 1 tablet (500 mg total) by mouth every 8 (eight) hours. 30 tablet 1   AJOVY 225 MG/1.5ML SOAJ INJECT 225 MG INTO THE SKIN EVERY 30 (THIRTY) DAYS. 1.5 mL 3   HYDROcodone-acetaminophen (NORCO) 7.5-325 MG tablet Take 1 tablet by mouth every 4 (four) hours as needed for moderate pain or severe pain. 42 tablet 0   methocarbamol (ROBAXIN) 500 MG tablet Take 1-2 tablets (500-1,000 mg total) by mouth every 8 (eight) hours as needed for muscle spasms. 50 tablet 0   ondansetron (ZOFRAN ODT) 4 MG disintegrating tablet Take 1 tablet (4 mg total) by mouth every 8 (eight) hours as needed for nausea or vomiting. 20 tablet 0   No facility-administered medications prior to visit.     PAST MEDICAL HISTORY: Past Medical History:  Diagnosis Date   Acid reflux    Anxiety    claustrophobia   Chronic headaches    COPD (chronic obstructive pulmonary disease) ([place])    Fracture    R heel hairline around 2007   Fracture 1974   left arm s/p internal fixation   Frequent urinary tract infections    Head injury    as a child x5    Migraines    Tobacco use      PAST SURGICAL HISTORY: Past Surgical History:  Procedure Laterality Date   FRACTURE SURGERY     as a child   NO PAST SURGERIES     OPEN REDUCTION INTERNAL FIXATION (ORIF) DISTAL RADIAL FRACTURE Left 06/21/2020   Procedure: OPEN REDUCTION INTERNAL FIXATION (ORIF) DISTAL RADIAL FRACTURE;  Surgeon: Altamese Burke, MD;  Location: [facility];  Service: Orthopedics;  Laterality: Left;     FAMILY HISTORY: Family History  Problem Relation Age of Onset   Migraines Mother    Colon cancer Mother    Kidney cancer Mother        started here, small cell, metastastic, less than 6 months    Diabetes Mother    Heart disease Brother    Cancer Brother    Migraines  Maternal Grandmother    Stroke Maternal Grandmother    Heart attack Maternal Grandmother    Cancer Maternal Grandmother    Emphysema Maternal Grandmother    COPD Maternal Grandmother    Migraines Other        on maternal side    Migraines Nephew      SOCIAL HISTORY: Social History   Socioeconomic History   Marital status: Single    Spouse name: Not on  file   Number of children: 1   Years of education: Not on file   Highest education level: Not on file  Occupational History   Not on file  Tobacco Use   Smoking status: Every Day    Packs/day: 1.00    Types: Cigarettes   Smokeless tobacco: Never  Vaping Use   Vaping Use: Never used  Substance and Sexual Activity   Alcohol use: Yes    Comment: occasional glass of wine   Drug use: Not Currently    Comment: used weed in her 69s    Sexual activity: Not on file  Other Topics Concern   Not on file  Social History Narrative   Lives alone   Caffeine: 2 large cups per day    Social Determinants of Health   Financial Resource Strain: Not on file  Food Insecurity: Not on file  Transportation Needs: Not on file  Physical Activity: Not on file  Stress: Not on file  Social Connections: Not on file  Intimate Partner Violence: Not on file      PHYSICAL EXAM  Vitals:   03/19/21 1020  BP: (!) 145/87  Pulse: 67  Weight: 200 lb (90.7 kg)  Height: 5' 2.75" (1.594 m)    Body mass index is 35.71 kg/m.   Generalized: Well developed, in no acute distress   Neurological examination  Mentation: Alert oriented to time, place, history taking. Follows all commands speech and language fluent Cranial nerve II-XII: Pupils were equal round reactive to light. Extraocular movements were full, visual field were full  Motor: The motor testing reveals 5 over 5 strength of all 4 extremities. Good symmetric motor tone is noted throughout.  Gait and station: Gait is normal.    DIAGNOSTIC DATA (LABS, IMAGING, TESTING) - I reviewed  patient records, labs, notes, testing and imaging myself where available.  Lab Results  Component Value Date   WBC 6.8 06/21/2020   HGB 14.6 06/21/2020   HCT 45.1 06/21/2020   MCV 93.6 06/21/2020   PLT 273 06/21/2020      Component Value Date/Time   NA 134 (L) 06/21/2020 0651   NA 139 09/06/2019 1022   K 4.0 06/21/2020 0651   CL 102 06/21/2020 0651   CO2 27 06/21/2020 0651   GLUCOSE 105 (H) 06/21/2020 0651   BUN 7 06/21/2020 0651   BUN 7 09/06/2019 1022   CREATININE 0.51 06/21/2020 0651   CALCIUM 9.0 06/21/2020 0651   PROT 6.9 06/21/2020 0651   PROT 6.9 09/06/2019 1022   ALBUMIN 3.6 06/21/2020 0651   ALBUMIN 4.3 09/06/2019 1022   AST 20 06/21/2020 0651   ALT 18 06/21/2020 0651   ALKPHOS 68 06/21/2020 0651   BILITOT 0.7 06/21/2020 0651   BILITOT 0.5 09/06/2019 1022   GFRNONAA >60 06/21/2020 0651   GFRAA 128 09/06/2019 1022   No results found for: CHOL, HDL, LDLCALC, LDLDIRECT, TRIG, CHOLHDL No results found for: HGBA1C No results found for: VITAMINB12 Lab Results  Component Value Date   TSH 1.940 09/06/2019     ASSESSMENT AND PLAN  56 y.o. year old female  has a past medical history of Acid reflux, Anxiety, Chronic headaches, COPD (chronic obstructive pulmonary disease) ([place]), Fracture, Fracture (1974), Frequent urinary tract infections, Head injury, Migraines, and Tobacco use. here with   Chronic migraine without aura without status migrainosus, not intractable  Caress reports Ajovy lost effectiveness and caused skin reaction. She continues to have daily tension headaches and 2-3 migraines a month. Roselyn Meier  works very well for abortive needs and she will continue. We will try her on Qulipta 60mg  daily. Rx sent to [facility]. She was encouraged to focus on regular exercise and well balanced meals. She will follow up with [place] in 6 months. She verbalizes understanding and agreement with this plan.    Debbora Presto, MSN, FNP-C 03/19/2021, 10:26 AM  [facility] [address], [place],  [address] [phone]

## 2021-03-18 NOTE — Patient Instructions (Signed)
Below is our plan:  We will try Qulipta 60mg  daily. Continue propranolol as directed by PCP. Continue Ubrelvy for abortive needs.   Please make sure you are staying well hydrated. I recommend 50-60 ounces daily. Well balanced diet and regular exercise encouraged. Consistent sleep schedule with 6-8 hours recommended.   Please continue follow up with care team as directed.   Follow up with me in 6 month  You may receive a survey regarding today's visit. I encourage you to leave honest feed back as I do use this information to improve patient care. Thank you for seeing me today!

## 2021-03-19 ENCOUNTER — Encounter: Payer: Self-pay | Admitting: Family Medicine

## 2021-03-19 ENCOUNTER — Ambulatory Visit: Payer: BC Managed Care – PPO | Admitting: Family Medicine

## 2021-03-19 VITALS — BP 145/87 | HR 67 | Ht 62.75 in | Wt 200.0 lb

## 2021-03-19 DIAGNOSIS — G43009 Migraine without aura, not intractable, without status migrainosus: Secondary | ICD-10-CM | POA: Diagnosis not present

## 2021-03-19 DIAGNOSIS — G43709 Chronic migraine without aura, not intractable, without status migrainosus: Secondary | ICD-10-CM

## 2021-03-20 ENCOUNTER — Other Ambulatory Visit: Payer: Self-pay | Admitting: Neurology

## 2021-03-20 MED ORDER — QULIPTA 60 MG PO TABS: 60.0000 mg | ORAL_TABLET | Freq: Every day | ORAL | 5 refills | Status: DC

## 2021-04-10 DIAGNOSIS — G43909 Migraine, unspecified, not intractable, without status migrainosus: Secondary | ICD-10-CM | POA: Diagnosis not present

## 2021-04-21 ENCOUNTER — Other Ambulatory Visit: Payer: Self-pay | Admitting: Neurology

## 2021-04-29 DIAGNOSIS — R197 Diarrhea, unspecified: Secondary | ICD-10-CM | POA: Diagnosis not present

## 2021-04-29 DIAGNOSIS — R112 Nausea with vomiting, unspecified: Secondary | ICD-10-CM | POA: Diagnosis not present

## 2021-04-29 DIAGNOSIS — Z03818 Encounter for observation for suspected exposure to other biological agents ruled out: Secondary | ICD-10-CM | POA: Diagnosis not present

## 2021-05-16 ENCOUNTER — Other Ambulatory Visit: Payer: Self-pay | Admitting: Family Medicine

## 2021-06-09 DIAGNOSIS — Z13220 Encounter for screening for lipoid disorders: Secondary | ICD-10-CM | POA: Diagnosis not present

## 2021-06-09 DIAGNOSIS — Z131 Encounter for screening for diabetes mellitus: Secondary | ICD-10-CM | POA: Diagnosis not present

## 2021-06-09 DIAGNOSIS — Z0000 Encounter for general adult medical examination without abnormal findings: Secondary | ICD-10-CM | POA: Diagnosis not present

## 2021-07-25 DIAGNOSIS — K529 Noninfective gastroenteritis and colitis, unspecified: Secondary | ICD-10-CM | POA: Diagnosis not present

## 2021-08-23 DIAGNOSIS — Z885 Allergy status to narcotic agent status: Secondary | ICD-10-CM | POA: Diagnosis not present

## 2021-08-23 DIAGNOSIS — Z888 Allergy status to other drugs, medicaments and biological substances status: Secondary | ICD-10-CM | POA: Diagnosis not present

## 2021-08-23 DIAGNOSIS — R0789 Other chest pain: Secondary | ICD-10-CM | POA: Diagnosis not present

## 2021-08-23 DIAGNOSIS — Z720 Tobacco use: Secondary | ICD-10-CM | POA: Diagnosis not present

## 2021-08-23 DIAGNOSIS — M6249 Contracture of muscle, multiple sites: Secondary | ICD-10-CM | POA: Diagnosis not present

## 2021-08-23 DIAGNOSIS — K219 Gastro-esophageal reflux disease without esophagitis: Secondary | ICD-10-CM | POA: Diagnosis not present

## 2021-08-23 DIAGNOSIS — R4781 Slurred speech: Secondary | ICD-10-CM | POA: Diagnosis not present

## 2021-08-23 DIAGNOSIS — R531 Weakness: Secondary | ICD-10-CM | POA: Diagnosis not present

## 2021-08-23 DIAGNOSIS — E669 Obesity, unspecified: Secondary | ICD-10-CM | POA: Diagnosis not present

## 2021-08-23 DIAGNOSIS — R072 Precordial pain: Secondary | ICD-10-CM | POA: Diagnosis not present

## 2021-08-23 DIAGNOSIS — Z91040 Latex allergy status: Secondary | ICD-10-CM | POA: Diagnosis not present

## 2021-08-23 DIAGNOSIS — R079 Chest pain, unspecified: Secondary | ICD-10-CM | POA: Diagnosis not present

## 2021-08-23 DIAGNOSIS — M6281 Muscle weakness (generalized): Secondary | ICD-10-CM | POA: Diagnosis not present

## 2021-08-23 DIAGNOSIS — F17210 Nicotine dependence, cigarettes, uncomplicated: Secondary | ICD-10-CM | POA: Diagnosis not present

## 2021-09-16 NOTE — Progress Notes (Unsigned)
No chief complaint on file.   HISTORY OF PRESENT ILLNESS:  09/16/21 ALL: Emily Holmes returns for follow up for migraines. We started Qulipta at last visit 03/2021.   03/19/2021 ALL:  Emily Holmes returns for follow up for migraines. She tried Ajovy but discontinued in August 2022 due to skin reactions. She felt that it helped headaches for a a little while but lost effectiveness. She is also taking propranolol ER 160mg  daily prescribed by PCP for BP management. She has some sort of headache every day. She cant remember a day without one. Most are fairly mild. She has about 2-3 migrainous headaches a month. Roselyn Meier does help with migraine abortion.   She reports having HST a few months ago. She wake multiple times trying to pull off leads. She reports provider recommended CPAP but only had 4 hour data. She does not feel she can tolerate CPAP. She refuses dental appliance. Not candidate for Inspire at this time. She has elevated head of bed and feels she sleeps better. Continues to have morning headaches. BP is usually "normal".   Tried and Failed: Ajovy (skin reaction), Wellbutrin and topiramate, propranolol, steroids, sumatriptan  01/01/2020 ALL: Emily Holmes is a 56 y.o. female here today for follow up for migraines. She was started on [place] in 08/2019. She also continues propranolol LA 160mg  daily. She feels that injections have helped with daily tension headaches. She continues to have near daily tension style headaches that are not as severe. Roselyn Meier has helped reduce severity of migraines but does not abort. She reports 2-3 migraines per month. She gets a migraine with every rain storm. She has never repeated Ubrelvy dose. She seems to have morning headaches often but states they can be worse in the evenings. BP 161/87 today. Last office reading 122/82. Blood pressure is usually "good" and it is only elevated when she has a migraine. She is unable to tell me a specific readings. She has a  significant family history of HTN. She is followed by PCP every 6 months. Her mom had OSA. She does not wish to pursue sleep study. She knows that she will not use CPAP. She is not interested in considering other treatments for OSA. She drinks 2-3 Bai drinks daily. She does not drink regular water. She was previously advised to consider Botox but she states insurance will not cover it.    HISTORY (copied from Dr Cathren Laine note on 09/06/2019)  HPI:  Emily Holmes is a 56 y.o. female here as requested by Emily Rota, NP for migraines.  I reviewed Emily Holmes notes: Past medical history migraines, anxiety, stress incontinence, mixed type COPD with acute bronchitis, adnexal fullness and tenderness.  Medications that she has tried that could be used in migraine management include Wellbutrin and topiramate, steroids, sumatriptan, propranolol.  She has apparently been to a headache clinic before but told Emily Holmes that she was not impressed with them.  At last appointment she reported increased frequency, lately getting headaches about once a week, episode lasting 1 to 3 days, headaches causing vision changes, vertigo, nausea, pressure behind the eyes, body temp changes, associated light and sound sensitivity, head feels like someone is driving red hot ice packs, barb wire wrapped around her head, she has also tried Fioricet, B complex, feverfew, riboflavin and magnesium, propranolol.  Recent labs include CMP collected June 01, 2019, essentially normal, BUN 7, creatinine 0.48.  Imitrex makes her sleepy, in the past they had discussed Elavil and Aimovig or other injectables,  ibuprofen when she develops migraines, significant weather seems to bring on migraines as well, she has associated visual disturbance, nausea, tinnitus and dizziness, she apparently has FMLA where she is limited to 10 hours a day due to her migraines, she can only work 6 days in a row, she definitely noted more headaches with working many  days in a row such as 21 which she reports her work has asked of her.  I reviewed records all the way through 2017, she is also stated headaches are severe 7 out of 10.   meds tried: Wellbutrin and topiramate, steroids, sumatriptan, propranolol ,topiramate, fioricet, b complex, feverfew, riboflavin, magnesium,    Patient states migraines since childhood, mother/nephew/sister/grandmother; extensive family history f migraines. She has had 5 head injuries in childhood unsure if related. Migraines started worsening since early menopause in 60s and slowly progressively worsening, over the last year or longer she has daily headaches, she wakes up with daily headaches and we discussed a sleep study and she declines. She does not sleep well. Migraines cause disequilibrium, vertigo, vision changes can't see the words on the past, can be unilateral or start both sides, from the back, pulsating/pounding/thrbbing.ice picks, nausea, light/sound sensitivity, movement make it worse, laying down in a black out room helps, weather barometric pressure makes it worse. She has vertigo, half her body gets cold even without the headache and happens with the headache pain. She has ringing in her ears not pulsatile, no aura, no medication overuse,also feels aband around the head and severe pressure,  ibuprofen doesn't really help. No other focal neurologic deficits, associated symptoms, inciting events or modifiable factors.   Reviewed notes, labs and imaging from outside physicians, which showed: see above   She had labs this past march, will request results do not have them   Reviewed MRI of th ebrain report:   IMPRESSION:  1.  No acute intracranial abnormality.  2.  At least a single small discrete white matter lesion in both external  capsules. These lesions are abnormal but nonspecific, usually resulting  from benign/remote/incidental causes (e.g. Prior  trauma/inflammation/demyelinization, or chronic ischemia  associated with  migraine/atherosclerosis/other vasculopathies processes). Favor mild  chronic ischemic/age-related changes      REVIEW OF SYSTEMS: Out of a complete 14 system review of symptoms, the patient complains only of the following symptoms, daily headaches, sinus congestion, and all other reviewed systems are negative.   ALLERGIES: Allergies  Allergen Reactions   Codeine Hives and Itching    Inside and out    Other     GLUCOSAMINE (has shellfish)- stomach upset STEROIDS- severe body aches and pains and it hurt to breathe WOOL- hives, itching    Shellfish-Derived Products     Stomach upset   Topiramate     Insomnia, loss of appetite   Wellbutrin [Bupropion]     Worsening migraines   Latex Rash    Whelps with latex band aid     HOME MEDICATIONS: Outpatient Medications Prior to Visit  Medication Sig Dispense Refill   albuterol (VENTOLIN HFA) 108 (90 Base) MCG/ACT inhaler Inhale 2 puffs into the lungs 3 (three) times daily as needed for wheezing or shortness of breath.     Ascorbic Acid (VITAMIN C) 1000 MG tablet Take 1,000 mg by mouth daily.     busPIRone (BUSPAR) 10 MG tablet Take 10 mg by mouth 2 (two) times daily.      cetirizine (ZYRTEC) 10 MG tablet Take 10 mg by mouth daily.  Cholecalciferol 125 MCG (5000 UT) TABS Take by mouth daily. 30 tablet 6   clobetasol cream (TEMOVATE) 0.05 % Apply 1 application topically daily as needed (Dry patch).     Cranberry-Vitamin C-Vitamin E 140-100-3 MG-MG-UNIT CAPS Take 2 tablets by mouth daily. AZO     omeprazole (PRILOSEC) 20 MG capsule Take 20 mg by mouth daily.     propranolol ER (INDERAL LA) 160 MG SR capsule Take 160 mg by mouth daily.     QULIPTA 60 MG TABS TAKE 1 TABLET BY MOUTH EVERY DAY 90 tablet 1   SUMAtriptan (IMITREX) 50 MG tablet Take 25-50 mg by mouth 2 (two) times daily as needed for migraine.     No facility-administered medications prior to visit.     PAST MEDICAL HISTORY: Past Medical History:   Diagnosis Date   Acid reflux    Anxiety    claustrophobia   Chronic headaches    COPD (chronic obstructive pulmonary disease) (HCC)    Fracture    R heel hairline around 2007   Fracture 1974   left arm s/p internal fixation   Frequent urinary tract infections    Head injury    as a child x5    Migraines    Tobacco use      PAST SURGICAL HISTORY: Past Surgical History:  Procedure Laterality Date   FRACTURE SURGERY     as a child   NO PAST SURGERIES     OPEN REDUCTION INTERNAL FIXATION (ORIF) DISTAL RADIAL FRACTURE Left 06/21/2020   Procedure: OPEN REDUCTION INTERNAL FIXATION (ORIF) DISTAL RADIAL FRACTURE;  Surgeon: Myrene Galas, MD;  Location: [facility] OR;  Service: Orthopedics;  Laterality: Left;     FAMILY HISTORY: Family History  Problem Relation Age of Onset   Migraines Mother    Colon cancer Mother    Kidney cancer Mother        started here, small cell, metastastic, less than 6 months    Diabetes Mother    Heart disease Brother    Cancer Brother    Migraines Maternal Grandmother    Stroke Maternal Grandmother    Heart attack Maternal Grandmother    Cancer Maternal Grandmother    Emphysema Maternal Grandmother    COPD Maternal Grandmother    Migraines Other        on maternal side    Migraines Nephew      SOCIAL HISTORY: Social History   Socioeconomic History   Marital status: Single    Spouse name: Not on file   Number of children: 1   Years of education: Not on file   Highest education level: Not on file  Occupational History   Not on file  Tobacco Use   Smoking status: Every Day    Packs/day: 1.00    Types: Cigarettes   Smokeless tobacco: Never  Vaping Use   Vaping Use: Never used  Substance and Sexual Activity   Alcohol use: Yes    Comment: occasional glass of wine   Drug use: Not Currently    Comment: used weed in her 85s    Sexual activity: Not on file  Other Topics Concern   Not on file  Social History Narrative   Lives alone    Caffeine: 2 large cups per day    Social Determinants of Health   Financial Resource Strain: Not on file  Food Insecurity: Not on file  Transportation Needs: Not on file  Physical Activity: Not on file  Stress: Not on file  Social Connections: Not on file  Intimate Partner Violence: Not on file      PHYSICAL EXAM  There were no vitals filed for this visit.   There is no height or weight on file to calculate BMI.   Generalized: Well developed, in no acute distress   Neurological examination  Mentation: Alert oriented to time, place, history taking. Follows all commands speech and language fluent Cranial nerve II-XII: Pupils were equal round reactive to light. Extraocular movements were full, visual field were full  Motor: The motor testing reveals 5 over 5 strength of all 4 extremities. Good symmetric motor tone is noted throughout.  Gait and station: Gait is normal.    DIAGNOSTIC DATA (LABS, IMAGING, TESTING) - I reviewed patient records, labs, notes, testing and imaging myself where available.  Lab Results  Component Value Date   WBC 6.8 06/21/2020   HGB 14.6 06/21/2020   HCT 45.1 06/21/2020   MCV 93.6 06/21/2020   PLT 273 06/21/2020      Component Value Date/Time   NA 134 (L) 06/21/2020 0651   NA 139 09/06/2019 1022   K 4.0 06/21/2020 0651   CL 102 06/21/2020 0651   CO2 27 06/21/2020 0651   GLUCOSE 105 (H) 06/21/2020 0651   BUN 7 06/21/2020 0651   BUN 7 09/06/2019 1022   CREATININE 0.51 06/21/2020 0651   CALCIUM 9.0 06/21/2020 0651   PROT 6.9 06/21/2020 0651   PROT 6.9 09/06/2019 1022   ALBUMIN 3.6 06/21/2020 0651   ALBUMIN 4.3 09/06/2019 1022   AST 20 06/21/2020 0651   ALT 18 06/21/2020 0651   ALKPHOS 68 06/21/2020 0651   BILITOT 0.7 06/21/2020 0651   BILITOT 0.5 09/06/2019 1022   GFRNONAA >60 06/21/2020 0651   GFRAA 128 09/06/2019 1022   No results found for: "CHOL", "HDL", "LDLCALC", "LDLDIRECT", "TRIG", "CHOLHDL" No results found for:  "HGBA1C" No results found for: "VITAMINB12" Lab Results  Component Value Date   TSH 1.940 09/06/2019     ASSESSMENT AND PLAN  56 y.o. year old female  has a past medical history of Acid reflux, Anxiety, Chronic headaches, COPD (chronic obstructive pulmonary disease) ([place]), Fracture, Fracture (1974), Frequent urinary tract infections, Head injury, Migraines, and Tobacco use. here with   No diagnosis found.  Kalanni reports Ajovy lost effectiveness and caused skin reaction. She continues to have daily tension headaches and 2-3 migraines a month. Roselyn Meier works very well for abortive needs and she will continue. We will try her on Qulipta 60mg  daily. Rx sent to [facility]. She was encouraged to focus on regular exercise and well balanced meals. She will follow up with [place] in 6 months. She verbalizes understanding and agreement with this plan.    Debbora Presto, MSN, FNP-C 09/16/2021, 4:25 PM  [facility] [address], [place], [place] [address] [phone]

## 2021-09-16 NOTE — Patient Instructions (Signed)
Below is our plan:  We will continue Qulipta 60mg  daily and Ubrelvy as needed. Continue propranolol and sumatriptan as directed by PCP.   Please make sure you are staying well hydrated. I recommend 50-60 ounces daily. Well balanced diet and regular exercise encouraged. Consistent sleep schedule with 6-8 hours recommended.   Please continue follow up with care team as directed.   Follow up with me in 1 year   You may receive a survey regarding today's visit. I encourage you to leave honest feed back as I do use this information to improve patient care. Thank you for seeing me today!

## 2021-09-17 ENCOUNTER — Ambulatory Visit (INDEPENDENT_AMBULATORY_CARE_PROVIDER_SITE_OTHER): Payer: BC Managed Care – PPO | Admitting: Family Medicine

## 2021-09-17 ENCOUNTER — Encounter: Payer: Self-pay | Admitting: Family Medicine

## 2021-09-17 VITALS — BP 136/85 | HR 64 | Ht 62.75 in | Wt 180.5 lb

## 2021-09-17 DIAGNOSIS — G43009 Migraine without aura, not intractable, without status migrainosus: Secondary | ICD-10-CM | POA: Diagnosis not present

## 2021-09-29 DIAGNOSIS — G43909 Migraine, unspecified, not intractable, without status migrainosus: Secondary | ICD-10-CM | POA: Diagnosis not present

## 2021-09-29 DIAGNOSIS — Z720 Tobacco use: Secondary | ICD-10-CM | POA: Diagnosis not present

## 2021-12-29 ENCOUNTER — Telehealth: Payer: Self-pay | Admitting: Family Medicine

## 2021-12-29 MED ORDER — QULIPTA 60 MG PO TABS: 1.0000 | ORAL_TABLET | Freq: Every day | ORAL | 2 refills | Status: AC

## 2021-12-29 NOTE — Telephone Encounter (Signed)
Pt is requesting a refill for QULIPTA 60 MG TABS.  Pharmacy: [id number]

## 2021-12-29 NOTE — Telephone Encounter (Signed)
E-scribed refill 

## 2022-01-09 ENCOUNTER — Other Ambulatory Visit (HOSPITAL_COMMUNITY): Payer: Self-pay | Admitting: Family Medicine

## 2022-01-09 DIAGNOSIS — R9389 Abnormal findings on diagnostic imaging of other specified body structures: Secondary | ICD-10-CM

## 2022-01-26 IMAGING — RF DG WRIST 2V*L*
1 series · 2 of 2 positions shown · non-contrast
Comparison: None.

CLINICAL DATA: ORIF distal radial fracture.

EXAM:
DG C-ARM 1-60 MIN; LEFT WRIST - 2 VIEW
FLUOROSCOPY TIME:  Fluoroscopy Time:  12.6 seconds.
Radiation Exposure Index (if provided by the fluoroscopic device):
0.12 mGy.
Number of Acquired Spot Images: 2

[Series 1: run · 2 of 2 slices shown]
[im 1/2]
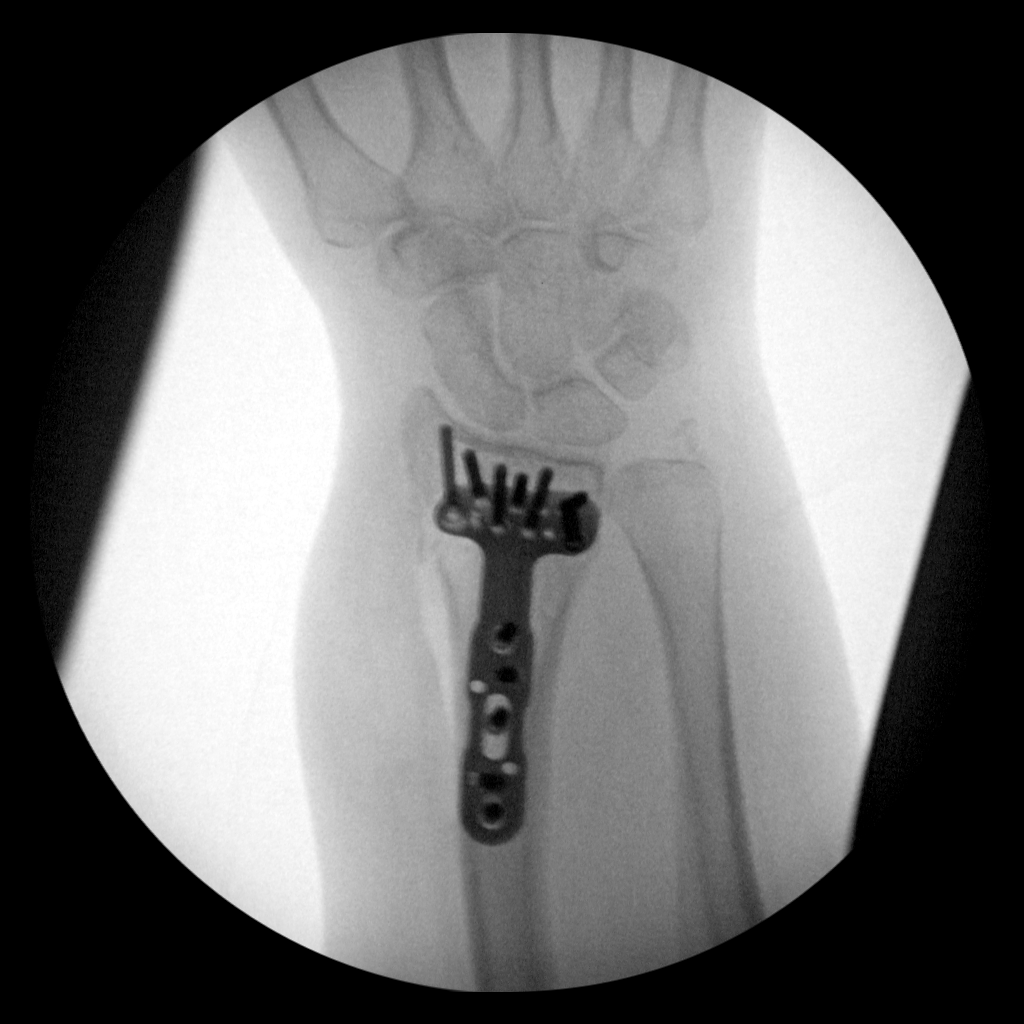
[im 2/2]
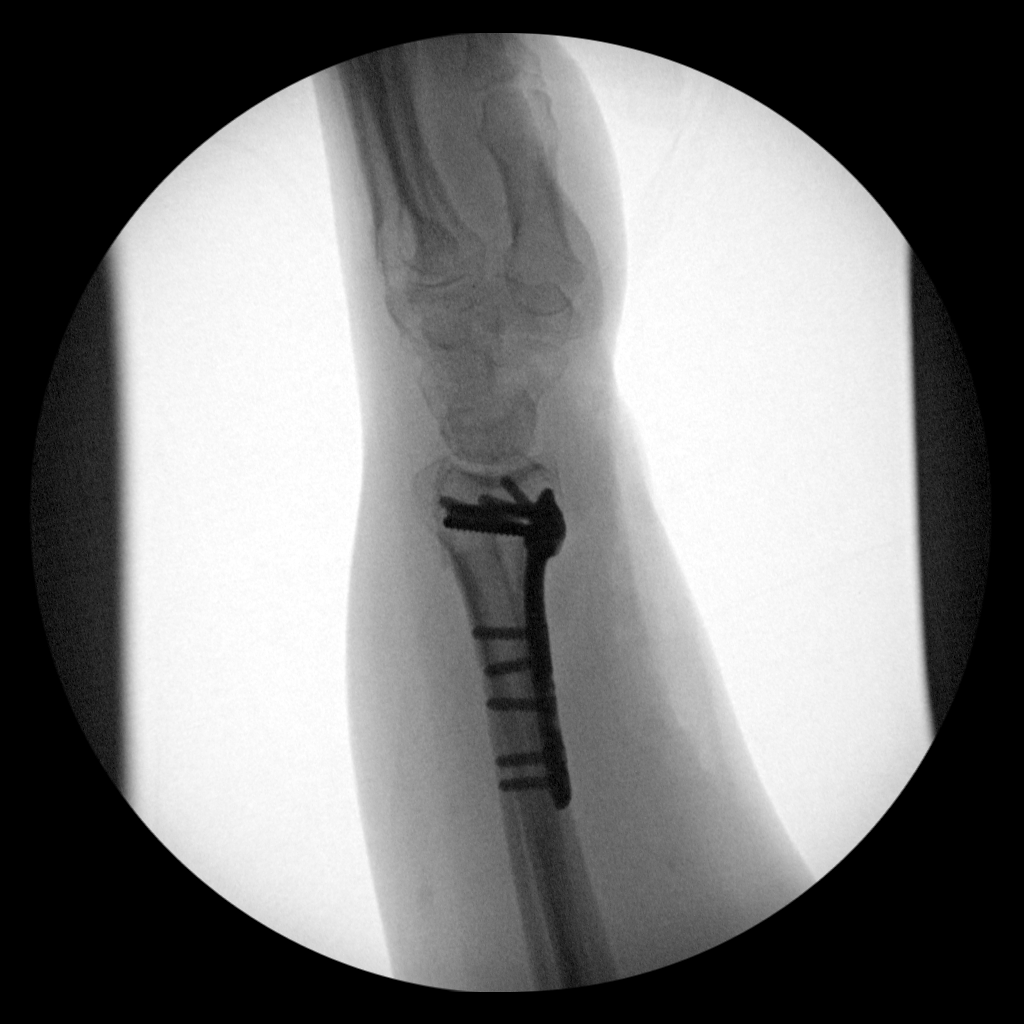

[2 of 2 positions shown; findings below may reference images not displayed]

FINDINGS: Two C-arm fluoroscopic images were obtained intraoperatively and
submitted for post operative interpretation. These images
demonstrate plate and screw fixation of a distal radial fracture.
Near anatomic alignment. Displaced ulnar styloid fracture. Please
see the performing provider's procedural report for further detail.
IMPRESSION: Plate and screw fixation of the distal radius fracture.

## 2022-01-26 IMAGING — RF DG C-ARM 1-60 MIN
1 series · 2 of 2 positions shown · non-contrast
Comparison: None.

CLINICAL DATA: ORIF distal radial fracture.

EXAM:
DG C-ARM 1-60 MIN; LEFT WRIST - 2 VIEW
FLUOROSCOPY TIME:  Fluoroscopy Time:  12.6 seconds.
Radiation Exposure Index (if provided by the fluoroscopic device):
0.12 mGy.
Number of Acquired Spot Images: 2

[Series 1: run · 2 of 2 slices shown]
[im 1/2]
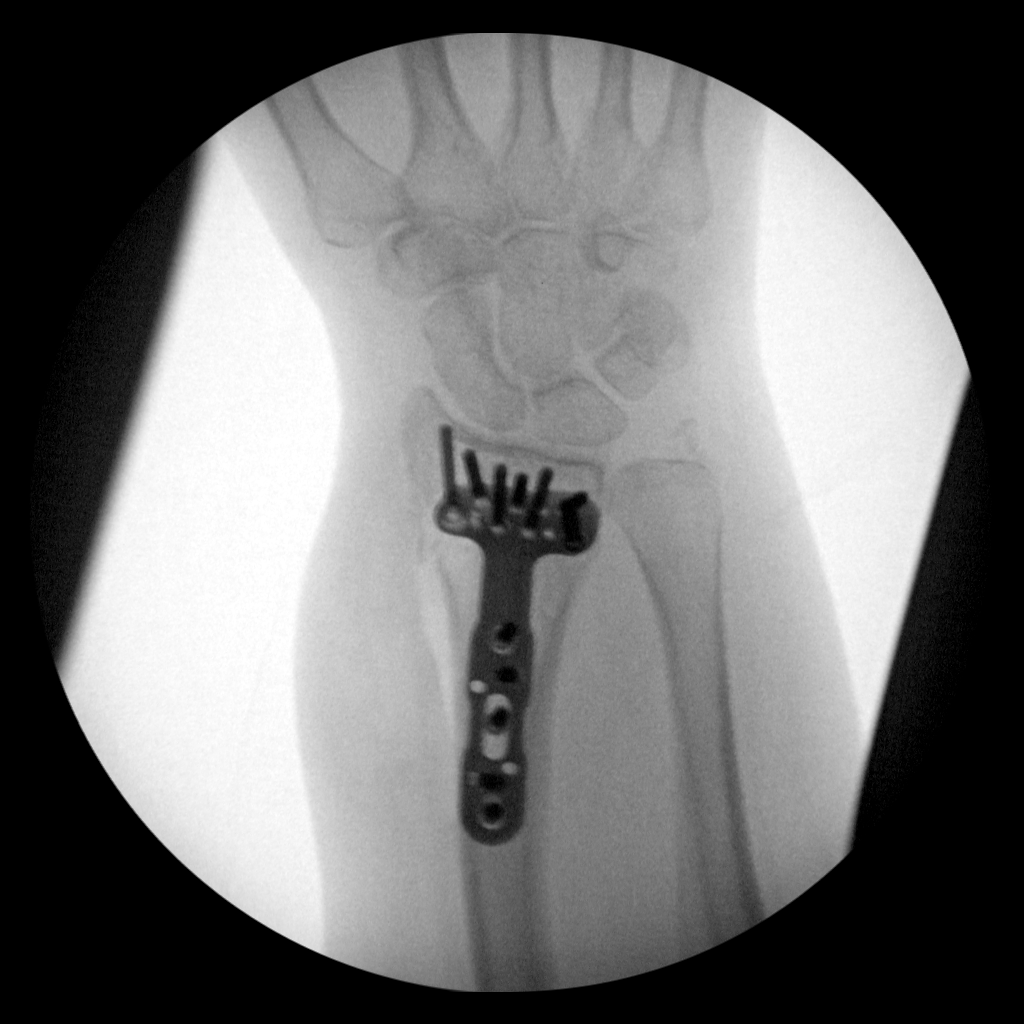
[im 2/2]
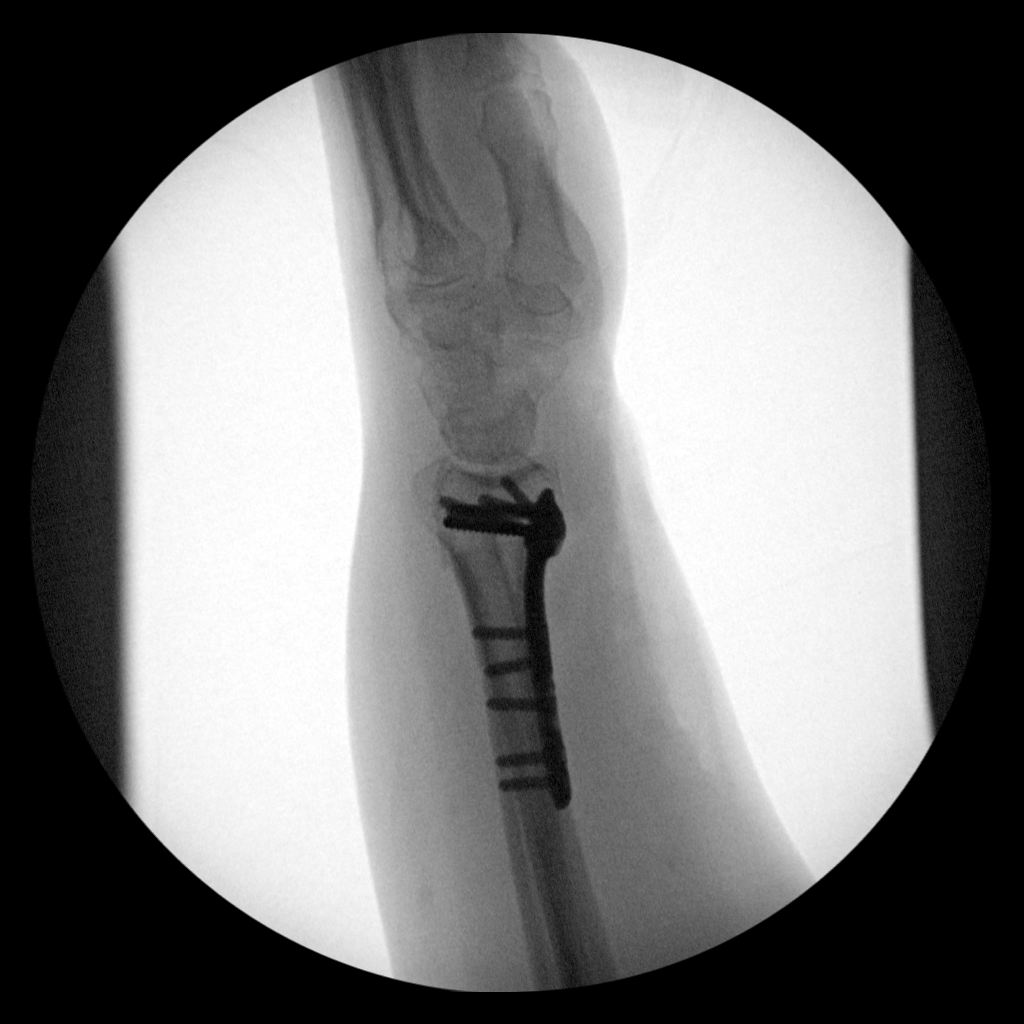

[2 of 2 positions shown; findings below may reference images not displayed]

FINDINGS: Two C-arm fluoroscopic images were obtained intraoperatively and
submitted for post operative interpretation. These images
demonstrate plate and screw fixation of a distal radial fracture.
Near anatomic alignment. Displaced ulnar styloid fracture. Please
see the performing provider's procedural report for further detail.
IMPRESSION: Plate and screw fixation of the distal radius fracture.

## 2022-02-01 DIAGNOSIS — K219 Gastro-esophageal reflux disease without esophagitis: Secondary | ICD-10-CM | POA: Diagnosis not present

## 2022-02-01 DIAGNOSIS — R079 Chest pain, unspecified: Secondary | ICD-10-CM | POA: Diagnosis not present

## 2022-02-01 DIAGNOSIS — R2990 Unspecified symptoms and signs involving the nervous system: Secondary | ICD-10-CM | POA: Diagnosis not present

## 2022-02-01 DIAGNOSIS — Q248 Other specified congenital malformations of heart: Secondary | ICD-10-CM | POA: Diagnosis not present

## 2022-02-01 DIAGNOSIS — I639 Cerebral infarction, unspecified: Secondary | ICD-10-CM | POA: Diagnosis not present

## 2022-02-01 DIAGNOSIS — I6782 Cerebral ischemia: Secondary | ICD-10-CM | POA: Diagnosis not present

## 2022-02-01 DIAGNOSIS — F10129 Alcohol abuse with intoxication, unspecified: Secondary | ICD-10-CM | POA: Diagnosis not present

## 2022-02-01 DIAGNOSIS — R200 Anesthesia of skin: Secondary | ICD-10-CM | POA: Diagnosis not present

## 2022-02-01 DIAGNOSIS — F17210 Nicotine dependence, cigarettes, uncomplicated: Secondary | ICD-10-CM | POA: Diagnosis not present

## 2022-02-01 DIAGNOSIS — Z79899 Other long term (current) drug therapy: Secondary | ICD-10-CM | POA: Diagnosis not present

## 2022-02-01 DIAGNOSIS — F10929 Alcohol use, unspecified with intoxication, unspecified: Secondary | ICD-10-CM | POA: Diagnosis not present

## 2022-02-01 DIAGNOSIS — R531 Weakness: Secondary | ICD-10-CM | POA: Diagnosis not present

## 2022-02-01 DIAGNOSIS — I517 Cardiomegaly: Secondary | ICD-10-CM | POA: Diagnosis not present

## 2022-02-01 DIAGNOSIS — R29818 Other symptoms and signs involving the nervous system: Secondary | ICD-10-CM | POA: Diagnosis not present

## 2022-02-01 DIAGNOSIS — F1090 Alcohol use, unspecified, uncomplicated: Secondary | ICD-10-CM | POA: Diagnosis not present

## 2022-02-08 DIAGNOSIS — R1012 Left upper quadrant pain: Secondary | ICD-10-CM | POA: Diagnosis not present

## 2022-02-08 DIAGNOSIS — R1010 Upper abdominal pain, unspecified: Secondary | ICD-10-CM | POA: Diagnosis not present

## 2022-02-08 DIAGNOSIS — Z720 Tobacco use: Secondary | ICD-10-CM | POA: Diagnosis not present

## 2022-02-08 DIAGNOSIS — J449 Chronic obstructive pulmonary disease, unspecified: Secondary | ICD-10-CM | POA: Diagnosis not present

## 2022-02-08 DIAGNOSIS — Y906 Blood alcohol level of 120-199 mg/100 ml: Secondary | ICD-10-CM | POA: Diagnosis not present

## 2022-02-08 DIAGNOSIS — J948 Other specified pleural conditions: Secondary | ICD-10-CM | POA: Diagnosis not present

## 2022-02-08 DIAGNOSIS — F10129 Alcohol abuse with intoxication, unspecified: Secondary | ICD-10-CM | POA: Diagnosis not present

## 2022-02-08 DIAGNOSIS — G43909 Migraine, unspecified, not intractable, without status migrainosus: Secondary | ICD-10-CM | POA: Diagnosis not present

## 2022-02-08 DIAGNOSIS — K8020 Calculus of gallbladder without cholecystitis without obstruction: Secondary | ICD-10-CM | POA: Diagnosis not present

## 2022-02-25 ENCOUNTER — Ambulatory Visit (HOSPITAL_COMMUNITY)
Admission: RE | Admit: 2022-02-25 | Discharge: 2022-02-25 | Disposition: A | Discharge status: Home / Self Care | Payer: BC Managed Care – PPO | Source: Ambulatory Visit | Attending: Family Medicine | Admitting: Family Medicine

## 2022-02-25 DIAGNOSIS — R9389 Abnormal findings on diagnostic imaging of other specified body structures: Secondary | ICD-10-CM | POA: Diagnosis not present

## 2022-02-25 DIAGNOSIS — C3490 Malignant neoplasm of unspecified part of unspecified bronchus or lung: Secondary | ICD-10-CM | POA: Diagnosis not present

## 2022-02-25 LAB — GLUCOSE, CAPILLARY: Glucose-Capillary: 109 mg/dL — ABNORMAL HIGH (ref 70–99)

## 2022-02-25 MED ORDER — FLUDEOXYGLUCOSE F - 18 (FDG) INJECTION
8.4100 | Freq: Once | INTRAVENOUS | Status: AC | PRN
  Administered 2022-02-25: 8.41 via INTRAVENOUS

## 2022-03-06 ENCOUNTER — Other Ambulatory Visit (HOSPITAL_COMMUNITY): Payer: Self-pay | Admitting: Family Medicine

## 2022-03-06 DIAGNOSIS — K828 Other specified diseases of gallbladder: Secondary | ICD-10-CM

## 2022-03-24 DIAGNOSIS — R051 Acute cough: Secondary | ICD-10-CM | POA: Diagnosis not present

## 2022-03-24 DIAGNOSIS — Z03818 Encounter for observation for suspected exposure to other biological agents ruled out: Secondary | ICD-10-CM | POA: Diagnosis not present

## 2022-05-06 DIAGNOSIS — M79602 Pain in left arm: Secondary | ICD-10-CM | POA: Diagnosis not present

## 2022-05-12 ENCOUNTER — Other Ambulatory Visit: Payer: Self-pay | Admitting: *Deleted

## 2022-05-12 MED ORDER — UBRELVY 100 MG PO TABS: ORAL_TABLET | ORAL | 3 refills | Status: AC

## 2022-05-12 NOTE — Telephone Encounter (Signed)
Last seen on 09/17/2021 Follow up scheduled on 09/21/22

## 2022-08-21 DIAGNOSIS — S2232XA Fracture of one rib, left side, initial encounter for closed fracture: Secondary | ICD-10-CM | POA: Diagnosis not present

## 2022-09-21 ENCOUNTER — Ambulatory Visit: Payer: BC Managed Care – PPO | Admitting: Family Medicine

## 2022-09-21 ENCOUNTER — Encounter: Payer: Self-pay | Admitting: Family Medicine

## 2022-09-21 NOTE — Progress Notes (Deleted)
No chief complaint on file.   HISTORY OF PRESENT ILLNESS:  09/21/22 ALL: Emily Holmes returns for follow up for migraines. She continues [place].   09/17/2021 ALL: Emily Holmes returns for follow up for migraines. We started Qulipta at last visit 03/2021. She feels that Bennie Pierini has helped with severity but continues to have 2-3 migraines a month. Bernita Raisin does help. She rarely uses sumatriptan. It does usually provide some relief if Ubrelvy doesn't. She continues propranolol ER 160mg  daily per PCP. Weather changes are common trigger.   03/19/2021 ALL:  Emily Holmes returns for follow up for migraines. She tried Ajovy but discontinued in August 2022 due to skin reactions. She felt that it helped headaches for a a little while but lost effectiveness. She is also taking propranolol ER 160mg  daily prescribed by PCP for BP management. She has some sort of headache every day. She cant remember a day without one. Most are fairly mild. She has about 2-3 migrainous headaches a month. Bernita Raisin does help with migraine abortion.   She reports having HST a few months ago. She wake multiple times trying to pull off leads. She reports provider recommended CPAP but only had 4 hour data. She does not feel she can tolerate CPAP. She refuses dental appliance. Not candidate for Inspire at this time. She has elevated head of bed and feels she sleeps better. Continues to have morning headaches. BP is usually "normal".   Tried and Failed: Ajovy (skin reaction), Wellbutrin and topiramate, propranolol, steroids, sumatriptan  01/01/2020 ALL: Emily Holmes is a 57 y.o. female here today for follow up for migraines. She was started on [place] in 08/2019. She also continues propranolol LA 160mg  daily. She feels that injections have helped with daily tension headaches. She continues to have near daily tension style headaches that are not as severe. Bernita Raisin has helped reduce severity of migraines but does not abort. She  reports 2-3 migraines per month. She gets a migraine with every rain storm. She has never repeated Ubrelvy dose. She seems to have morning headaches often but states they can be worse in the evenings. BP 161/87 today. Last office reading 122/82. Blood pressure is usually "good" and it is only elevated when she has a migraine. She is unable to tell me a specific readings. She has a significant family history of HTN. She is followed by PCP every 6 months. Her mom had OSA. She does not wish to pursue sleep study. She knows that she will not use CPAP. She is not interested in considering other treatments for OSA. She drinks 2-3 Bai drinks daily. She does not drink regular water. She was previously advised to consider Botox but she states insurance will not cover it.   HISTORY (copied from Dr Trevor Mace note on 09/06/2019)  HPI:  Emily Holmes is a 57 y.o. female here as requested by Mitzi Hansen, NP for migraines.  I reviewed [facility] notes: Past medical history migraines, anxiety, stress incontinence, mixed type COPD with acute bronchitis, adnexal fullness and tenderness.  Medications that she has tried that could be used in migraine management include Wellbutrin and topiramate, steroids, sumatriptan, propranolol.  She has apparently been to a headache clinic before but told Mitzi Hansen that she was not impressed with them.  At last appointment she reported increased frequency, lately getting headaches about once a week, episode lasting 1 to 3 days, headaches causing vision changes, vertigo, nausea, pressure behind the eyes, body temp changes, associated light and sound sensitivity,  head feels like someone is driving red hot ice packs, barb wire wrapped around her head, she has also tried Fioricet, B complex, feverfew, riboflavin and magnesium, propranolol.  Recent labs include CMP collected June 01, 2019, essentially normal, BUN 7, creatinine 0.48.  Imitrex makes her sleepy, in the past they had discussed  Elavil and Aimovig or other injectables, ibuprofen when she develops migraines, significant weather seems to bring on migraines as well, she has associated visual disturbance, nausea, tinnitus and dizziness, she apparently has FMLA where she is limited to 10 hours a day due to her migraines, she can only work 6 days in a row, she definitely noted more headaches with working many days in a row such as 21 which she reports her work has asked of her.  I reviewed records all the way through 2017, she is also stated headaches are severe 7 out of 10.   meds tried: Wellbutrin and topiramate, steroids, sumatriptan, propranolol ,topiramate, fioricet, b complex, feverfew, riboflavin, magnesium,    Patient states migraines since childhood, mother/nephew/sister/grandmother; extensive family history f migraines. She has had 5 head injuries in childhood unsure if related. Migraines started worsening since early menopause in 89s and slowly progressively worsening, over the last year or longer she has daily headaches, she wakes up with daily headaches and we discussed a sleep study and she declines. She does not sleep well. Migraines cause disequilibrium, vertigo, vision changes can't see the words on the past, can be unilateral or start both sides, from the back, pulsating/pounding/thrbbing.ice picks, nausea, light/sound sensitivity, movement make it worse, laying down in a black out room helps, weather barometric pressure makes it worse. She has vertigo, half her body gets cold even without the headache and happens with the headache pain. She has ringing in her ears not pulsatile, no aura, no medication overuse,also feels aband around the head and severe pressure,  ibuprofen doesn't really help. No other focal neurologic deficits, associated symptoms, inciting events or modifiable factors.   Reviewed notes, labs and imaging from outside physicians, which showed: see above   She had labs this past march, will request  results do not have them   Reviewed MRI of th ebrain report:   IMPRESSION:  1.  No acute intracranial abnormality.  2.  At least a single small discrete white matter lesion in both external  capsules. These lesions are abnormal but nonspecific, usually resulting  from benign/remote/incidental causes (e.g. Prior  trauma/inflammation/demyelinization, or chronic ischemia associated with  migraine/atherosclerosis/other vasculopathies processes). Favor mild  chronic ischemic/age-related changes   REVIEW OF SYSTEMS: Out of a complete 14 system review of symptoms, the patient complains only of the following symptoms, daily headaches, sinus congestion, and all other reviewed systems are negative.   ALLERGIES: Allergies  Allergen Reactions   Codeine Hives and Itching    Inside and out    Other     GLUCOSAMINE (has shellfish)- stomach upset STEROIDS- severe body aches and pains and it hurt to breathe WOOL- hives, itching    Shellfish-Derived Products     Stomach upset   Topiramate     Insomnia, loss of appetite   Wellbutrin [Bupropion]     Worsening migraines   Latex Rash    Whelps with latex band aid     HOME MEDICATIONS: Outpatient Medications Prior to Visit  Medication Sig Dispense Refill   albuterol (VENTOLIN HFA) 108 (90 Base) MCG/ACT inhaler Inhale 2 puffs into the lungs 3 (three) times daily as needed for wheezing or  shortness of breath.     Ascorbic Acid (VITAMIN C) 1000 MG tablet Take 1,000 mg by mouth daily.     Atogepant (QULIPTA) 60 MG TABS Take 1 tablet by mouth daily. 90 tablet 2   busPIRone (BUSPAR) 10 MG tablet Take 10 mg by mouth 2 (two) times daily.      cetirizine (ZYRTEC) 10 MG tablet Take 10 mg by mouth daily.     Cholecalciferol 125 MCG (5000 UT) TABS Take by mouth daily. 30 tablet 6   clobetasol cream (TEMOVATE) 0.05 % Apply 1 application topically daily as needed (Dry patch).     Cranberry-Vitamin C-Vitamin E 140-100-3 MG-MG-UNIT CAPS Take 2 tablets by  mouth daily. AZO     omeprazole (PRILOSEC) 20 MG capsule Take 20 mg by mouth daily.     propranolol ER (INDERAL LA) 160 MG SR capsule Take 160 mg by mouth daily.     SUMAtriptan (IMITREX) 50 MG tablet Take 25-50 mg by mouth 2 (two) times daily as needed for migraine.     Ubrogepant (UBRELVY) 100 MG TABS Take 100 mg by mouth every 2 (two) hours as needed (migraine). Maximum 200mg  a day. 10 tablet 3   No facility-administered medications prior to visit.     PAST MEDICAL HISTORY: Past Medical History:  Diagnosis Date   Acid reflux    Anxiety    claustrophobia   Chronic headaches    COPD (chronic obstructive pulmonary disease) (HCC)    Fracture    R heel hairline around 2007   Fracture 1974   left arm s/p internal fixation   Frequent urinary tract infections    Head injury    as a child x5    Migraines    Tobacco use      PAST SURGICAL HISTORY: Past Surgical History:  Procedure Laterality Date   FRACTURE SURGERY     as a child   NO PAST SURGERIES     OPEN REDUCTION INTERNAL FIXATION (ORIF) DISTAL RADIAL FRACTURE Left 06/21/2020   Procedure: OPEN REDUCTION INTERNAL FIXATION (ORIF) DISTAL RADIAL FRACTURE;  Surgeon: Myrene Galas, MD;  Location: [facility] OR;  Service: Orthopedics;  Laterality: Left;     FAMILY HISTORY: Family History  Problem Relation Age of Onset   Migraines Mother    Colon cancer Mother    Kidney cancer Mother        started here, small cell, metastastic, less than 6 months    Diabetes Mother    Heart disease Brother    Cancer Brother    Migraines Maternal Grandmother    Stroke Maternal Grandmother    Heart attack Maternal Grandmother    Cancer Maternal Grandmother    Emphysema Maternal Grandmother    COPD Maternal Grandmother    Migraines Other        on maternal side    Migraines Nephew      SOCIAL HISTORY: Social History   Socioeconomic History   Marital status: Single    Spouse name: Not on file   Number of children: 1   Years of  education: Not on file   Highest education level: Not on file  Occupational History   Not on file  Tobacco Use   Smoking status: Every Day    Current packs/day: 1.00    Types: Cigarettes   Smokeless tobacco: Never  Vaping Use   Vaping status: Never Used  Substance and Sexual Activity   Alcohol use: Yes    Comment: occasional glass of wine  Drug use: Not Currently    Comment: used weed in her 73s    Sexual activity: Not on file  Other Topics Concern   Not on file  Social History Narrative   Lives alone   Caffeine: 2 large cups per day    Social Determinants of Health   Financial Resource Strain: Not on file  Food Insecurity: Not on file  Transportation Needs: Not on file  Physical Activity: Not on file  Stress: Not on file  Social Connections: Unknown (08/23/2021)   Received from [facility], [facility]   Social Network    Social Network: Not on file  Intimate Partner Violence: Unknown (08/23/2021)   Received from [facility], [facility]   HITS    Physically Hurt: Not on file    Insult or Talk Down To: Not on file    Threaten Physical Harm: Not on file    Scream or Curse: Not on file      PHYSICAL EXAM  There were no vitals filed for this visit.    There is no height or weight on file to calculate BMI.   Generalized: Well developed, in no acute distress   Neurological examination  Mentation: Alert oriented to time, place, history taking. Follows all commands speech and language fluent Cranial nerve II-XII: Pupils were equal round reactive to light. Extraocular movements were full, visual field were full  Motor: The motor testing reveals 5 over 5 strength of all 4 extremities. Good symmetric motor tone is noted throughout.  Gait and station: Gait is normal.    DIAGNOSTIC DATA (LABS, IMAGING, TESTING) - I reviewed patient records, labs, notes, testing and imaging myself where available.  Lab Results  Component Value Date   WBC 6.8 06/21/2020    HGB 14.6 06/21/2020   HCT 45.1 06/21/2020   MCV 93.6 06/21/2020   PLT 273 06/21/2020      Component Value Date/Time   NA 134 (L) 06/21/2020 0651   NA 139 09/06/2019 1022   K 4.0 06/21/2020 0651   CL 102 06/21/2020 0651   CO2 27 06/21/2020 0651   GLUCOSE 105 (H) 06/21/2020 0651   BUN 7 06/21/2020 0651   BUN 7 09/06/2019 1022   CREATININE 0.51 06/21/2020 0651   CALCIUM 9.0 06/21/2020 0651   PROT 6.9 06/21/2020 0651   PROT 6.9 09/06/2019 1022   ALBUMIN 3.6 06/21/2020 0651   ALBUMIN 4.3 09/06/2019 1022   AST 20 06/21/2020 0651   ALT 18 06/21/2020 0651   ALKPHOS 68 06/21/2020 0651   BILITOT 0.7 06/21/2020 0651   BILITOT 0.5 09/06/2019 1022   GFRNONAA >60 06/21/2020 0651   GFRAA 128 09/06/2019 1022   No results found for: "CHOL", "HDL", "LDLCALC", "LDLDIRECT", "TRIG", "CHOLHDL" No results found for: "HGBA1C" No results found for: "VITAMINB12" Lab Results  Component Value Date   TSH 1.940 09/06/2019     ASSESSMENT AND PLAN  57 y.o. year old female  has a past medical history of Acid reflux, Anxiety, Chronic headaches, COPD (chronic obstructive pulmonary disease) (HCC), Fracture, Fracture (1974), Frequent urinary tract infections, Head injury, Migraines, and Tobacco use. here with   No diagnosis found.  Kale reports Bennie Pierini has helped decreased severity of migraines. Bernita Raisin works very well for abortive needs and she will continue. She was encouraged to continue propranolol and sumatriptan as directed by PCP. She was encouraged to focus on regular exercise and well balanced meals. She will follow up with [place] in 1 year. She verbalizes understanding and  agreement with this plan.    Shawnie Dapper, MSN, FNP-C 09/21/2022, 7:45 AM  [facility] [address], [address] [place], [place] [address] [phone]
